# Patient Record
Sex: Male | Born: 1975
Health system: Southern US, Community
[De-identification: ages and names within clinical notes are randomized; demographics above are authoritative.]

## PROBLEM LIST (undated history)

## (undated) DIAGNOSIS — H1013 Acute atopic conjunctivitis, bilateral: Secondary | ICD-10-CM

## (undated) DIAGNOSIS — T7840XA Allergy, unspecified, initial encounter: Secondary | ICD-10-CM

## (undated) DIAGNOSIS — K219 Gastro-esophageal reflux disease without esophagitis: Secondary | ICD-10-CM

## (undated) DIAGNOSIS — J309 Allergic rhinitis, unspecified: Secondary | ICD-10-CM

## (undated) DIAGNOSIS — E785 Hyperlipidemia, unspecified: Secondary | ICD-10-CM

## (undated) HISTORY — DX: Gastro-esophageal reflux disease without esophagitis: K21.9

## (undated) HISTORY — DX: Allergic rhinitis, unspecified: J30.9

## (undated) HISTORY — DX: Acute atopic conjunctivitis, bilateral: H10.13

## (undated) HISTORY — DX: Hyperlipidemia, unspecified: E78.5

## (undated) HISTORY — DX: Allergy, unspecified, initial encounter: T78.40XA

## (undated) HISTORY — PX: COLONOSCOPY: SHX174

---

## 2001-07-04 ENCOUNTER — Ambulatory Visit (HOSPITAL_COMMUNITY): Admission: RE | Admit: 2001-07-04 | Discharge: 2001-07-04 | Payer: Self-pay | Admitting: Internal Medicine

## 2001-07-04 ENCOUNTER — Encounter: Payer: Self-pay | Admitting: Internal Medicine

## 2004-11-12 ENCOUNTER — Emergency Department (HOSPITAL_COMMUNITY): Admission: EM | Admit: 2004-11-12 | Discharge: 2004-11-12 | Payer: Self-pay | Admitting: Emergency Medicine

## 2005-05-17 ENCOUNTER — Ambulatory Visit (HOSPITAL_COMMUNITY): Admission: RE | Admit: 2005-05-17 | Discharge: 2005-05-17 | Payer: Self-pay | Admitting: Family Medicine

## 2012-01-25 ENCOUNTER — Encounter: Payer: Self-pay | Admitting: Internal Medicine

## 2012-01-25 DIAGNOSIS — Z0001 Encounter for general adult medical examination with abnormal findings: Secondary | ICD-10-CM | POA: Insufficient documentation

## 2012-01-25 DIAGNOSIS — Z0000 Encounter for general adult medical examination without abnormal findings: Secondary | ICD-10-CM | POA: Insufficient documentation

## 2012-01-29 ENCOUNTER — Ambulatory Visit (INDEPENDENT_AMBULATORY_CARE_PROVIDER_SITE_OTHER): Payer: BC Managed Care – PPO | Admitting: Internal Medicine

## 2012-01-29 ENCOUNTER — Telehealth: Payer: Self-pay | Admitting: Internal Medicine

## 2012-01-29 ENCOUNTER — Encounter: Payer: Self-pay | Admitting: Internal Medicine

## 2012-01-29 ENCOUNTER — Other Ambulatory Visit (INDEPENDENT_AMBULATORY_CARE_PROVIDER_SITE_OTHER): Payer: BC Managed Care – PPO

## 2012-01-29 VITALS — BP 128/88 | HR 54 | Temp 98.2°F | Ht 65.0 in | Wt 169.0 lb

## 2012-01-29 DIAGNOSIS — J309 Allergic rhinitis, unspecified: Secondary | ICD-10-CM

## 2012-01-29 DIAGNOSIS — H1045 Other chronic allergic conjunctivitis: Secondary | ICD-10-CM

## 2012-01-29 DIAGNOSIS — A64 Unspecified sexually transmitted disease: Secondary | ICD-10-CM

## 2012-01-29 DIAGNOSIS — H1013 Acute atopic conjunctivitis, bilateral: Secondary | ICD-10-CM

## 2012-01-29 DIAGNOSIS — J45909 Unspecified asthma, uncomplicated: Secondary | ICD-10-CM | POA: Insufficient documentation

## 2012-01-29 DIAGNOSIS — N476 Balanoposthitis: Secondary | ICD-10-CM

## 2012-01-29 DIAGNOSIS — Z0000 Encounter for general adult medical examination without abnormal findings: Secondary | ICD-10-CM

## 2012-01-29 DIAGNOSIS — N481 Balanitis: Secondary | ICD-10-CM | POA: Insufficient documentation

## 2012-01-29 HISTORY — DX: Acute atopic conjunctivitis, bilateral: H10.13

## 2012-01-29 LAB — LIPID PANEL
Cholesterol: 180 mg/dL (ref 0–200)
LDL Cholesterol: 125 mg/dL — ABNORMAL HIGH (ref 0–99)
Triglycerides: 24 mg/dL (ref 0.0–149.0)
VLDL: 4.8 mg/dL (ref 0.0–40.0)

## 2012-01-29 LAB — HIV ANTIBODY (ROUTINE TESTING W REFLEX): HIV: NONREACTIVE

## 2012-01-29 LAB — BASIC METABOLIC PANEL
BUN: 14 mg/dL (ref 6–23)
Chloride: 101 mEq/L (ref 96–112)
Creatinine, Ser: 1.2 mg/dL (ref 0.4–1.5)
Glucose, Bld: 89 mg/dL (ref 70–99)

## 2012-01-29 LAB — HEPATIC FUNCTION PANEL
AST: 26 U/L (ref 0–37)
Albumin: 4.2 g/dL (ref 3.5–5.2)
Alkaline Phosphatase: 54 U/L (ref 39–117)
Total Protein: 7.5 g/dL (ref 6.0–8.3)

## 2012-01-29 LAB — URINALYSIS, ROUTINE W REFLEX MICROSCOPIC
Hgb urine dipstick: NEGATIVE
Ketones, ur: NEGATIVE
Leukocytes, UA: NEGATIVE
Specific Gravity, Urine: 1.015 (ref 1.000–1.030)
Urobilinogen, UA: 1 (ref 0.0–1.0)

## 2012-01-29 LAB — CBC WITH DIFFERENTIAL/PLATELET
Basophils Absolute: 0 10*3/uL (ref 0.0–0.1)
Eosinophils Absolute: 0.1 10*3/uL (ref 0.0–0.7)
Lymphocytes Relative: 33.9 % (ref 12.0–46.0)
Lymphs Abs: 2 10*3/uL (ref 0.7–4.0)
Monocytes Relative: 7.6 % (ref 3.0–12.0)
Platelets: 177 10*3/uL (ref 150.0–400.0)
RDW: 12.9 % (ref 11.5–14.6)

## 2012-01-29 LAB — TSH: TSH: 1.22 u[IU]/mL (ref 0.35–5.50)

## 2012-01-29 MED ORDER — OLOPATADINE HCL 0.1 % OP SOLN: 1.0000 [drp] | Freq: Two times a day (BID) | OPHTHALMIC | Status: AC | PRN

## 2012-01-29 MED ORDER — CLOTRIMAZOLE-BETAMETHASONE 1-0.05 % EX CREA: TOPICAL_CREAM | CUTANEOUS | Status: AC

## 2012-01-29 MED ORDER — FEXOFENADINE HCL 180 MG PO TABS: 180.0000 mg | ORAL_TABLET | Freq: Every day | ORAL | Status: DC

## 2012-01-29 MED ORDER — METHYLPREDNISOLONE ACETATE 80 MG/ML IJ SUSP
120.0000 mg | Freq: Once | INTRAMUSCULAR | Status: AC
  Administered 2012-01-29: 120 mg via INTRAMUSCULAR

## 2012-01-29 NOTE — Telephone Encounter (Signed)
Done per emr 

## 2012-01-29 NOTE — Assessment & Plan Note (Signed)
Mild to mod, for allegra prn, and depomedrol IM today,  to f/u any worsening symptoms or concerns

## 2012-01-29 NOTE — Patient Instructions (Signed)
You had the steroid shot today for the allergy symptoms Take all new medications as prescribed - the generic allegra as needed for allergy, as well the cream for the rash as discussed You are otherwise up to date with prevention Please go to LAB in the Basement for the blood and/or urine tests to be done today You will be contacted by phone if any changes need to be made immediately.  Otherwise, you will receive a letter about your results with an explanation. Please return in 1 year for your yearly visit, or sooner if needed, with Lab testing done 3-5 days before

## 2012-01-29 NOTE — Progress Notes (Signed)
Subjective:    Patient ID: JEREMY DITULLIO, male    DOB: 08-13-1976, 36 y.o.   MRN: [id number]  HPI  Here to establish as new pt, father comes here as well.  Last saw MD approx 2007, usually in excellent health. Here for wellness;  Overall doing ok;  Pt denies CP, worsening SOB, DOE, wheezing, orthopnea, PND, worsening LE edema, palpitations, dizziness or syncope.  Pt denies neurological change such as new Headache, facial or extremity weakness.  Pt denies polydipsia, polyuria, or low sugar symptoms. Pt states overall good compliance with treatment and medications, good tolerability, and trying to follow lower cholesterol diet.  Pt denies worsening depressive symptoms, suicidal ideation or panic. No fever, wt loss, night sweats, loss of appetite, or other constitutional symptoms.  Pt states good ability with ADL's, low fall risk, home safety reviewed and adequate, no significant changes in hearing or vision, and occasionally active with exercise.  Does have concern about STD and has a discoloration of the glans penis for exam today as well.  Also  - Does have several wks ongoing bilat eye allergy symptoms with mild erythema, itch without signficnat d/c, pain, vision change, fever, and no signficant nasal symptoms Past Medical History  Diagnosis Date  . Asthma   . Allergic rhinitis, cause unspecified    History reviewed. No pertinent past surgical history.  reports that he has never smoked. He has never used smokeless tobacco. He reports that he does not drink alcohol or use illicit drugs. family history includes Cancer in his other; Diabetes in his others; and Heart disease in his other. No Known Allergies No current outpatient prescriptions on file prior to visit.   Review of Systems Review of Systems  Constitutional: Negative for diaphoresis, activity change, appetite change and unexpected weight change.  HENT: Negative for hearing loss, ear pain, facial swelling, mouth sores and neck  stiffness.   Eyes: Negative for pain, redness and visual disturbance.  Respiratory: Negative for shortness of breath and wheezing.   Cardiovascular: Negative for chest pain and palpitations.  Gastrointestinal: Negative for diarrhea, blood in stool, abdominal distention and rectal pain.  Genitourinary: Negative for hematuria, flank pain and decreased urine volume.  Musculoskeletal: Negative for myalgias and joint swelling.  Skin: Negative for color change and wound.  Neurological: Negative for syncope and numbness.  Hematological: Negative for adenopathy.  Psychiatric/Behavioral: Negative for hallucinations, self-injury, decreased concentration and agitation.      Objective:   Physical Exam BP 128/88  Pulse 54  Temp(Src) 98.2 F (36.8 C) (Oral)  Ht 5\' 5"  (1.651 m)  Wt 169 lb (76.658 kg)  BMI 28.12 kg/m2  SpO2 96% Physical Exam  VS noted Constitutional: Pt is oriented to person, place, and time. Appears well-developed and well-nourished.  HENT: Bilat tm's mild erythema.  Sinus nontender.  Pharynx mild erythema Head: Normocephalic and atraumatic.  Right Ear: External ear normal.  Left Ear: External ear normal.  Nose: Nose normal.  Mouth/Throat: Oropharynx is clear and moist.  Eyes: Conjunctivae and EOM are normal except for bilat mild erythema  Pupils are equal, round, and reactive to light.  Neck: Normal range of motion. Neck supple. No JVD present. No tracheal deviation present.  Cardiovascular: Normal rate, regular rhythm, normal heart sounds and intact distal pulses.   Pulmonary/Chest: Effort normal and breath sounds normal.  Abdominal: Soft. Bowel sounds are normal. There is no tenderness.  Musculoskeletal: Normal range of motion. Exhibits no edema.  Lymphadenopathy:  Has no cervical adenopathy.  Neurological:  Pt is alert and oriented to person, place, and time. Pt has normal reflexes. No cranial nerve deficit.  Skin: Skin is warm and dry. No rash noted.  Psychiatric:  Has   normal mood and affect. Behavior is normal.  GU: normal male penis and scrotum/contents except for mild nontender lightening/discoloration of most of glans penis, no penile d/c or other ulcer, swelling    Assessment & Plan:

## 2012-01-29 NOTE — Assessment & Plan Note (Signed)
Pt requests std labs - will order

## 2012-01-29 NOTE — Assessment & Plan Note (Signed)
Mild to mod, for antibx course,  to f/u any worsening symptoms or concerns 

## 2012-01-29 NOTE — Telephone Encounter (Signed)
Message copied by Corwin Levins on Tue Jan 29, 2012  2:10 PM ------      Message from: Scharlene Gloss B      Created: Tue Jan 29, 2012  1:50 PM       The patient would like eye drops sent to pharmacy.

## 2012-01-30 ENCOUNTER — Encounter: Payer: Self-pay | Admitting: Internal Medicine

## 2012-01-30 LAB — HEPATITIS PANEL, ACUTE: HCV Ab: NEGATIVE

## 2012-01-30 LAB — GC/CHLAMYDIA PROBE AMP, URINE: GC Probe Amp, Urine: NEGATIVE

## 2012-01-30 LAB — HSV 2 ANTIBODY, IGG: HSV 2 Glycoprotein G Ab, IgG: <0.1 IV

## 2012-05-28 ENCOUNTER — Telehealth: Payer: Self-pay

## 2012-05-28 DIAGNOSIS — D759 Disease of blood and blood-forming organs, unspecified: Secondary | ICD-10-CM

## 2012-05-28 NOTE — Telephone Encounter (Signed)
The patients wife is pregnant and OBGYN is requesting he be tested for sickle cell.  Please advise call back number is [phone]

## 2012-05-28 NOTE — Telephone Encounter (Signed)
Order done

## 2012-05-28 NOTE — Telephone Encounter (Signed)
Patient informed. 

## 2012-05-30 ENCOUNTER — Other Ambulatory Visit: Payer: Self-pay | Admitting: Internal Medicine

## 2012-05-30 ENCOUNTER — Other Ambulatory Visit: Payer: BC Managed Care – PPO

## 2012-05-30 DIAGNOSIS — D759 Disease of blood and blood-forming organs, unspecified: Secondary | ICD-10-CM

## 2012-06-03 ENCOUNTER — Encounter: Payer: Self-pay | Admitting: Internal Medicine

## 2012-06-03 LAB — HEMOGLOBINOPATHY EVALUATION
Hemoglobin Other: 0 %
Hgb A: 96.9 % (ref 96.8–97.8)

## 2012-08-29 ENCOUNTER — Telehealth: Payer: Self-pay | Admitting: *Deleted

## 2012-08-29 ENCOUNTER — Ambulatory Visit: Payer: Self-pay | Admitting: Family Medicine

## 2012-08-29 VITALS — BP 118/78 | HR 74 | Temp 98.3°F | Resp 16 | Ht 65.0 in | Wt 174.8 lb

## 2012-08-29 DIAGNOSIS — Z0289 Encounter for other administrative examinations: Secondary | ICD-10-CM

## 2012-08-29 NOTE — Progress Notes (Signed)
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 He does have chronic allergies, especially the eyes but also rash respiratory.  Objective: HEENT TMs are normal eyes slightly injected PERRLA throat clear. Neck supple without nodes thyromegaly. Chest clear. Heart regular without murmurs. And soft without mass tenderness. Normal external genitalia. No nodes were noted. Extremities unremarkable. Spine normal. Skin normal.  Assessment: Normal DOT examination  Plan: Form completed 2 year card

## 2012-08-29 NOTE — Telephone Encounter (Signed)
Steward Drone called to give a fax number to send dot form because monocular vision was not checked. So I checked and initialed and tried to faxed but the fax number given by Steward Drone was wrong.  Pt will call us back with correct number.  Form is in phone messages at nurses station.  Please refax.

## 2012-08-29 NOTE — Patient Instructions (Addendum)
Return as needed

## 2012-09-01 ENCOUNTER — Telehealth: Payer: Self-pay

## 2012-09-01 NOTE — Telephone Encounter (Signed)
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 He says we were trying to fax this to his employer but it would not go thru. He called this morning to provide correct fax # [phone]  Pt [phone]

## 2012-09-01 NOTE — Telephone Encounter (Signed)
Spoke with pt and he gave me another fax number. Faxed

## 2013-05-08 ENCOUNTER — Ambulatory Visit: Payer: Self-pay | Admitting: Physician Assistant

## 2013-05-08 VITALS — BP 120/74 | HR 94 | Temp 103.1°F | Resp 16 | Ht 64.5 in | Wt 172.8 lb

## 2013-05-08 DIAGNOSIS — J020 Streptococcal pharyngitis: Secondary | ICD-10-CM

## 2013-05-08 DIAGNOSIS — R509 Fever, unspecified: Secondary | ICD-10-CM

## 2013-05-08 DIAGNOSIS — J029 Acute pharyngitis, unspecified: Secondary | ICD-10-CM

## 2013-05-08 LAB — POCT RAPID STREP A (OFFICE): Rapid Strep A Screen: POSITIVE — AB

## 2013-05-08 MED ORDER — AMOXICILLIN 500 MG PO TABS: 500.0000 mg | ORAL_TABLET | Freq: Two times a day (BID) | ORAL | Status: DC

## 2013-05-08 NOTE — Progress Notes (Signed)
  Subjective:    Patient ID: Andrew Bailey, male    DOB: 1976/10/07, 36 y.o.   MRN: [id number]  HPI 37 year old male presents with 3 day history of sore throat, fever, chills, body aches, and headache. Admits it has become increasingly painful to swallow. Says his wife had a sore throat x 2 days but hers has improved significantly with ibuprofen only. No known strep contacts or significant hx of strep.  Denies nasal congestion, otalgia, sinus pain, cough, dizziness, nausea, or vomiting.  Patient is otherwise healthy with no other concerns today.     Review of Systems  Constitutional: Positive for fever and chills.  HENT: Positive for sore throat. Negative for ear pain, congestion, rhinorrhea and postnasal drip.   Respiratory: Negative for cough.   Gastrointestinal: Negative for nausea, vomiting and abdominal pain.  Neurological: Positive for headaches. Negative for dizziness.       Objective:   Physical Exam  Constitutional: He is oriented to person, place, and time. He appears well-developed and well-nourished.  HENT:  Head: Normocephalic and atraumatic.  Right Ear: Hearing, tympanic membrane, external ear and ear canal normal.  Left Ear: Hearing, tympanic membrane, external ear and ear canal normal.  Mouth/Throat: Uvula is midline and mucous membranes are normal. Oropharyngeal exudate (2+ tonsillar swelling) and posterior oropharyngeal erythema present. No posterior oropharyngeal edema or tonsillar abscesses.  Eyes: Conjunctivae are normal.  Neck: Normal range of motion. Neck supple.  Cardiovascular: Normal rate, regular rhythm and normal heart sounds.   Pulmonary/Chest: Effort normal and breath sounds normal.  Lymphadenopathy:    He has cervical adenopathy.  Neurological: He is alert and oriented to person, place, and time.  Psychiatric: He has a normal mood and affect. His behavior is normal. Judgment and thought content normal.   Results for orders placed in visit on  05/08/13  POCT RAPID STREP A (OFFICE)      Result Value Range   Rapid Strep A Screen Positive (*) Negative          Assessment & Plan:  Acute pharyngitis - Plan: POCT rapid strep A  Fever, unspecified  Streptococcal sore throat - Plan: amoxicillin (AMOXIL) 500 MG tablet  Amoxicillin 500 mg bid x 10 days Continue ibuprofen and tylenol in alternating doses q4-6 hours prn fever and pain Increase fluids and rest Follow up if symptoms worsen or fail to improve.

## 2013-05-26 ENCOUNTER — Other Ambulatory Visit: Payer: Self-pay | Admitting: Family Medicine

## 2013-05-26 DIAGNOSIS — J020 Streptococcal pharyngitis: Secondary | ICD-10-CM

## 2013-05-26 MED ORDER — AMOXICILLIN 500 MG PO TABS: 500.0000 mg | ORAL_TABLET | Freq: Two times a day (BID) | ORAL | Status: DC

## 2013-08-12 ENCOUNTER — Telehealth: Payer: Self-pay

## 2013-08-12 DIAGNOSIS — Z0000 Encounter for general adult medical examination without abnormal findings: Secondary | ICD-10-CM

## 2013-08-12 NOTE — Telephone Encounter (Signed)
Labs entered.

## 2013-09-04 ENCOUNTER — Ambulatory Visit: Payer: BC Managed Care – PPO | Admitting: Emergency Medicine

## 2013-09-04 VITALS — BP 120/76 | HR 72 | Temp 98.2°F | Resp 16 | Ht 64.75 in | Wt 172.4 lb

## 2013-09-04 DIAGNOSIS — J209 Acute bronchitis, unspecified: Secondary | ICD-10-CM

## 2013-09-04 MED ORDER — AZITHROMYCIN 250 MG PO TABS: ORAL_TABLET | ORAL | Status: DC

## 2013-09-04 MED ORDER — GUAIFENESIN-DM 100-10 MG/5ML PO SYRP: 5.0000 mL | ORAL_SOLUTION | ORAL | Status: DC | PRN

## 2013-09-04 NOTE — Patient Instructions (Signed)

## 2013-09-04 NOTE — Progress Notes (Signed)
[facility] [address] [place] [address] [id number] [phone]  Date:  09/04/2013   Name:  Andrew Bailey   DOB:  07/30/1976   MRN:  [id number]  PCP:  Oliver Barre, MD    Chief Complaint: Cough and Sore Throat   History of Present Illness:  Andrew Bailey is a 37 y.o. very pleasant male patient who presents with the following:  1 week history of cough productive mucopurulent sputum.  No worse when lays down.  No wheezing or shortness of breath.  No nausea or vomiting.  No stool change, rash or coryza.  Started with sore throat.  No ill contact.  No improvement with over the counter medications or other home remedies. Denies other complaint or health concern today.   Patient Active Problem List   Diagnosis Date Noted  . Allergic conjunctivitis of both eyes 01/29/2012  . Balanitis 01/29/2012  . Venereal disease, unspecified 01/29/2012  . Asthma   . Allergic rhinitis, cause unspecified   . Preventative health care 01/25/2012    Past Medical History  Diagnosis Date  . Allergic rhinitis, cause unspecified   . Allergic conjunctivitis of both eyes 01/29/2012  . Asthma     No past surgical history on file.  History  Substance Use Topics  . Smoking status: Never Smoker   . Smokeless tobacco: Never Used  . Alcohol Use: No    Family History  Problem Relation Age of Onset  . Heart disease Other   . Diabetes Other   . Cancer Other     colon and breast cancer  . Diabetes Other   . Diabetes Mother   . Heart disease Father   . Diabetes Father   . Hypertension Sister     No Known Allergies  Medication list has been reviewed and updated.  Current Outpatient Prescriptions on File Prior to Visit  Medication Sig Dispense Refill  . ibuprofen (ADVIL,MOTRIN) 400 MG tablet Take 400 mg by mouth every 6 (six) hours as needed for pain.      Marland Kitchen loratadine (CLARITIN) 10 MG tablet Take 10 mg by mouth daily.      Marland Kitchen amoxicillin (AMOXIL) 500 MG tablet Take 1  tablet (500 mg total) by mouth 2 (two) times daily.  20 tablet  0  . fexofenadine (ALLEGRA) 180 MG tablet Take 1 tablet (180 mg total) by mouth daily.  30 tablet  11   No current facility-administered medications on file prior to visit.    Review of Systems:  As per HPI, otherwise negative.    Physical Examination: Filed Vitals:   09/04/13 0844  BP: 120/76  Pulse: 72  Temp: 98.2 F (36.8 C)  Resp: 16   Filed Vitals:   09/04/13 0844  Height: 5' 4.75" (1.645 m)  Weight: 172 lb 6.4 oz (78.2 kg)   Body mass index is 28.9 kg/(m^2). Ideal Body Weight: Weight in (lb) to have BMI = 25: 148.8  GEN: WDWN, NAD, Non-toxic, A & O x 3 HEENT: Atraumatic, Normocephalic. Neck supple. No masses, No LAD. Ears and Nose: No external deformity. CV: RRR, No M/G/R. No JVD. No thrill. No extra heart sounds. PULM: CTA B, no wheezes, crackles, rhonchi. No retractions. No resp. distress. No accessory muscle use. ABD: S, NT, ND, +BS. No rebound. No HSM. EXTR: No c/c/e NEURO Normal gait.  PSYCH: Normally interactive. Conversant. Not depressed or anxious appearing.  Calm demeanor.    Assessment and Plan: Bronchitis zpak Follow up as  needed   Signed,  Phillips Odor, MD

## 2013-10-07 ENCOUNTER — Encounter: Payer: Self-pay | Admitting: Internal Medicine

## 2013-10-07 ENCOUNTER — Other Ambulatory Visit (INDEPENDENT_AMBULATORY_CARE_PROVIDER_SITE_OTHER): Payer: BC Managed Care – PPO

## 2013-10-07 ENCOUNTER — Ambulatory Visit (INDEPENDENT_AMBULATORY_CARE_PROVIDER_SITE_OTHER): Payer: BC Managed Care – PPO | Admitting: Internal Medicine

## 2013-10-07 VITALS — BP 124/80 | HR 67 | Temp 97.9°F | Ht 64.75 in | Wt 174.0 lb

## 2013-10-07 DIAGNOSIS — Z0000 Encounter for general adult medical examination without abnormal findings: Secondary | ICD-10-CM

## 2013-10-07 DIAGNOSIS — J309 Allergic rhinitis, unspecified: Secondary | ICD-10-CM

## 2013-10-07 DIAGNOSIS — Z23 Encounter for immunization: Secondary | ICD-10-CM

## 2013-10-07 LAB — URINALYSIS, ROUTINE W REFLEX MICROSCOPIC
Bilirubin Urine: NEGATIVE
Hgb urine dipstick: NEGATIVE
Ketones, ur: NEGATIVE
Leukocytes, UA: NEGATIVE
Nitrite: NEGATIVE
RBC / HPF: NONE SEEN
Specific Gravity, Urine: 1.02
Total Protein, Urine: NEGATIVE
Urine Glucose: NEGATIVE
Urobilinogen, UA: 0.2
WBC, UA: NONE SEEN
pH: 6.5 (ref 5.0–8.0)

## 2013-10-07 LAB — LIPID PANEL
Cholesterol: 186 mg/dL (ref 0–200)
HDL: 45.3 mg/dL (ref >39.00)
Total CHOL/HDL Ratio: 4
Triglycerides: 53 mg/dL (ref 0.0–149.0)

## 2013-10-07 LAB — CBC WITH DIFFERENTIAL/PLATELET
Basophils Absolute: 0 10*3/uL (ref 0.0–0.1)
Basophils Relative: 0.4 % (ref 0.0–3.0)
Eosinophils Absolute: 0.2 10*3/uL (ref 0.0–0.7)
Hemoglobin: 16.6 g/dL (ref 13.0–17.0)
Lymphocytes Relative: 32.9 % (ref 12.0–46.0)
MCHC: 33.2 g/dL (ref 30.0–36.0)
MCV: 84.9 fl (ref 78.0–100.0)
Monocytes Relative: 9.3 % (ref 3.0–12.0)
Neutro Abs: 4.1 10*3/uL (ref 1.4–7.7)
Neutrophils Relative %: 55 % (ref 43.0–77.0)
RBC: 5.88 Mil/uL — ABNORMAL HIGH (ref 4.22–5.81)

## 2013-10-07 LAB — HEPATIC FUNCTION PANEL
ALT: 45 U/L (ref 0–53)
AST: 29 U/L (ref 0–37)
Albumin: 4.2 g/dL (ref 3.5–5.2)
Alkaline Phosphatase: 55 U/L (ref 39–117)
Bilirubin, Direct: 0.1 mg/dL (ref 0.0–0.3)
Total Bilirubin: 0.8 mg/dL (ref 0.3–1.2)
Total Protein: 7.3 g/dL (ref 6.0–8.3)

## 2013-10-07 LAB — BASIC METABOLIC PANEL
BUN: 20 mg/dL (ref 6–23)
CO2: 30 mEq/L (ref 19–32)
Calcium: 9.2 mg/dL (ref 8.4–10.5)
Chloride: 103 mEq/L (ref 96–112)
Creatinine, Ser: 1.2 mg/dL (ref 0.4–1.5)
Glucose, Bld: 75 mg/dL (ref 70–99)

## 2013-10-07 LAB — TSH: TSH: 1.64 u[IU]/mL (ref 0.35–5.50)

## 2013-10-07 MED ORDER — FLUTICASONE PROPIONATE 50 MCG/ACT NA SUSP: 2.0000 | Freq: Every day | NASAL | Status: DC

## 2013-10-07 NOTE — Patient Instructions (Signed)
You had the flu shot today, and the Tetanus with pertussis (Tdap) shot today Please take all new medication as prescribed- the generic flonase for the nasal allergies You can also use OTC Zaditor for the eyes as well for allergies Please continue all other medications as before Please have the pharmacy call with any other refills you may need. Please continue your efforts at being more active, low cholesterol diet, and weight control. You are otherwise up to date with prevention measures today. Please go to the LAB in the Basement (turn left off the elevator) for the tests to be done today You will be contacted by phone if any changes need to be made immediately.  Otherwise, you will receive a letter about your results with an explanation, but please check with MyChart first.  Please remember to sign up for My Chart if you have not done so, as this will be important to you in the future with finding out test results, communicating by private email, and scheduling acute appointments online when needed.  Please return in 1 year for your yearly visit, or sooner if needed, with Lab testing done 3-5 days before

## 2013-10-07 NOTE — Progress Notes (Signed)
Subjective:    Patient ID: Andrew Bailey, male    DOB: Mar 07, 1976, 37 y.o.   MRN: [id number]  HPI  Here for wellness and f/u;  Overall doing ok;  Pt denies CP, worsening SOB, DOE, wheezing, orthopnea, PND, worsening LE edema, palpitations, dizziness or syncope.  Pt denies neurological change such as new headache, facial or extremity weakness.  Pt denies polydipsia, polyuria, or low sugar symptoms. Pt states overall good compliance with treatment and medications, good tolerability, and has been trying to follow lower cholesterol diet.  Pt denies worsening depressive symptoms, suicidal ideation or panic. No fever, night sweats, wt loss, loss of appetite, or other constitutional symptoms.  Pt states good ability with ADL's, has low fall risk, home safety reviewed and adequate, no other significant changes in hearing or vision, and only occasionally active with exercise.  Does have several wks ongoing nasal allergy symptoms with clearish congestion, itch and sneezing, without fever, pain, ST, cough, swelling or wheezing. Past Medical History  Diagnosis Date  . Allergic rhinitis, cause unspecified   . Allergic conjunctivitis of both eyes 01/29/2012  . Asthma    No past surgical history on file.  reports that he has never smoked. He has never used smokeless tobacco. He reports that he does not drink alcohol or use illicit drugs. family history includes Cancer in his other; Diabetes in his father, mother, other, and other; Heart disease in his father and other; Hypertension in his sister. No Known Allergies Current Outpatient Prescriptions on File Prior to Visit  Medication Sig Dispense Refill  . ibuprofen (ADVIL,MOTRIN) 400 MG tablet Take 400 mg by mouth every 6 (six) hours as needed for pain.      Marland Kitchen loratadine (CLARITIN) 10 MG tablet Take 10 mg by mouth daily.      Marland Kitchen amoxicillin (AMOXIL) 500 MG tablet Take 1 tablet (500 mg total) by mouth 2 (two) times daily.  20 tablet  0  . azithromycin  (ZITHROMAX) 250 MG tablet Take 2 tabs PO x 1 dose, then 1 tab PO QD x 4 days  6 tablet  0  . fexofenadine (ALLEGRA) 180 MG tablet Take 1 tablet (180 mg total) by mouth daily.  30 tablet  11  . guaiFENesin-dextromethorphan (ROBITUSSIN DM) 100-10 MG/5ML syrup Take 5 mLs by mouth every 4 (four) hours as needed for cough.  118 mL  0   No current facility-administered medications on file prior to visit.   Review of Systems Constitutional: Negative for diaphoresis, activity change, appetite change or unexpected weight change.  HENT: Negative for hearing loss, ear pain, facial swelling, mouth sores and neck stiffness.   Eyes: Negative for pain, redness and visual disturbance.  Respiratory: Negative for shortness of breath and wheezing.   Cardiovascular: Negative for chest pain and palpitations.  Gastrointestinal: Negative for diarrhea, blood in stool, abdominal distention or other pain Genitourinary: Negative for hematuria, flank pain or change in urine volume.  Musculoskeletal: Negative for myalgias and joint swelling.  Skin: Negative for color change and wound.  Neurological: Negative for syncope and numbness. other than noted Hematological: Negative for adenopathy.  Psychiatric/Behavioral: Negative for hallucinations, self-injury, decreased concentration and agitation.      Objective:   Physical Exam BP 124/80  Pulse 67  Temp(Src) 97.9 F (36.6 C) (Oral)  Ht 5' 4.75" (1.645 m)  Wt 174 lb (78.926 kg)  BMI 29.17 kg/m2  SpO2 96% VS noted,  Constitutional: Pt is oriented to person, place, and time. Appears well-developed and  well-nourished.  Head: Normocephalic and atraumatic.  Right Ear: External ear normal.  Left Ear: External ear normal.  Nose: Nose normal.  Mouth/Throat: Oropharynx is clear and moist.  Bilat tm's with mild erythema.  Max sinus areas non tender.  Pharynx with mild erythema, no exudate Eyes: Conjunctivae and EOM are normal. Pupils are equal, round, and reactive to  light.  Neck: Normal range of motion. Neck supple. No JVD present. No tracheal deviation present.  Cardiovascular: Normal rate, regular rhythm, normal heart sounds and intact distal pulses.   Pulmonary/Chest: Effort normal and breath sounds normal.  Abdominal: Soft. Bowel sounds are normal. There is no tenderness. No HSM  Musculoskeletal: Normal range of motion. Exhibits no edema.  Lymphadenopathy:  Has no cervical adenopathy.  Neurological: Pt is alert and oriented to person, place, and time. Pt has normal reflexes. No cranial nerve deficit.  Skin: Skin is warm and dry. No rash noted.  Psychiatric:  Has  normal mood and affect. Behavior is normal.      Assessment & Plan:

## 2013-10-07 NOTE — Assessment & Plan Note (Signed)
For flonase asd, zaditor prn

## 2013-10-07 NOTE — Progress Notes (Signed)
Pre visit review using our clinic review tool, if applicable. No additional management support is needed unless otherwise documented below in the visit note. 

## 2013-10-07 NOTE — Assessment & Plan Note (Signed)

## 2014-01-13 ENCOUNTER — Encounter: Payer: Self-pay | Admitting: Internal Medicine

## 2014-01-13 ENCOUNTER — Ambulatory Visit (INDEPENDENT_AMBULATORY_CARE_PROVIDER_SITE_OTHER): Payer: BC Managed Care – PPO | Admitting: Internal Medicine

## 2014-01-13 VITALS — BP 120/80 | HR 65 | Temp 98.3°F | Ht 65.0 in | Wt 170.0 lb

## 2014-01-13 DIAGNOSIS — R21 Rash and other nonspecific skin eruption: Secondary | ICD-10-CM

## 2014-01-13 MED ORDER — CLOTRIMAZOLE-BETAMETHASONE 1-0.05 % EX CREA: TOPICAL_CREAM | CUTANEOUS | Status: DC

## 2014-01-13 NOTE — Progress Notes (Signed)
   Subjective:    Patient ID: Andrew Bailey, male    DOB: January 20, 1976, 38 y.o.   MRN: [id number]  HPI  Here to f/u with c/o itchy rash with several discrete oval and round areas various sized up to 2 cm, to legs from hips to knees, some few below knee on right. No fever, trauma, wt loss, swelling, or prior hx of same. Past Medical History  Diagnosis Date  . Allergic rhinitis, cause unspecified   . Allergic conjunctivitis of both eyes 01/29/2012  . Asthma    No past surgical history on file.  reports that he has never smoked. He has never used smokeless tobacco. He reports that he does not drink alcohol or use illicit drugs. family history includes Cancer in his other; Diabetes in his father, mother, other, and other; Heart disease in his father and other; Hypertension in his sister. No Known Allergies Current Outpatient Prescriptions on File Prior to Visit  Medication Sig Dispense Refill  . fexofenadine (ALLEGRA) 180 MG tablet Take 1 tablet (180 mg total) by mouth daily.  30 tablet  11  . fluticasone (FLONASE) 50 MCG/ACT nasal spray Place 2 sprays into both nostrils daily.  16 g  5  . ibuprofen (ADVIL,MOTRIN) 400 MG tablet Take 400 mg by mouth every 6 (six) hours as needed for pain.      Marland Kitchen loratadine (CLARITIN) 10 MG tablet Take 10 mg by mouth daily.       No current facility-administered medications on file prior to visit.   Review of Systems All otherwise neg per pt     Objective:   Physical Exam  Constitutional: Negative for unexpected weight change, or unusual diaphoresis  HENT: Negative for tinnitus.   Eyes: Negative for photophobia and visual disturbance.  Respiratory: Negative for choking and stridor.   Gastrointestinal: Negative for vomiting and blood in stool.  Genitourinary: Negative for hematuria and decreased urine volume.  Skin: Negative for color change and wound. except for mult mild erythem macular areas up to 2 cm oval or round to LE's, NT, no swelling      Assessment & Plan:

## 2014-01-13 NOTE — Progress Notes (Signed)
Pre visit review using our clinic review tool, if applicable. No additional management support is needed unless otherwise documented below in the visit note. 

## 2014-01-13 NOTE — Assessment & Plan Note (Signed)
C/w fungal or irritiative, for lotrisone asd,  to f/u any worsening symptoms or concerns

## 2014-01-13 NOTE — Patient Instructions (Signed)
Please take all new medication as prescribed Please continue all other medications as before, and refills have been done if requested. Please have the pharmacy call with any other refills you may need.  Please continue your efforts at being more active, low cholesterol diet, and weight control.  Please keep your appointments with your specialists as you may have planned  Please remember to sign up for MyChart if you have not done so, as this will be important to you in the future with finding out test results, communicating by private email, and scheduling acute appointments online when needed.

## 2014-04-23 ENCOUNTER — Telehealth: Payer: Self-pay | Admitting: Internal Medicine

## 2014-04-23 NOTE — Telephone Encounter (Signed)
Pt called stated that he got a bill from the ov with Dr. Jenny Reichmann 01/13/14. He talked to the billing and they told him to call our office for more detail about what he got charge for. Please call pt

## 2014-07-19 ENCOUNTER — Ambulatory Visit (INDEPENDENT_AMBULATORY_CARE_PROVIDER_SITE_OTHER): Payer: BC Managed Care – PPO | Admitting: Physician Assistant

## 2014-07-19 VITALS — BP 118/70 | HR 74 | Temp 98.7°F | Resp 16 | Ht 64.75 in | Wt 172.2 lb

## 2014-07-19 DIAGNOSIS — J029 Acute pharyngitis, unspecified: Secondary | ICD-10-CM

## 2014-07-19 DIAGNOSIS — J020 Streptococcal pharyngitis: Secondary | ICD-10-CM

## 2014-07-19 LAB — POCT RAPID STREP A (OFFICE): RAPID STREP A SCREEN: POSITIVE — AB

## 2014-07-19 MED ORDER — PENICILLIN G BENZATHINE 1200000 UNIT/2ML IM SUSP
1.2000 10*6.[IU] | Freq: Once | INTRAMUSCULAR | Status: AC
  Administered 2014-07-19: 1.2 10*6.[IU] via INTRAMUSCULAR

## 2014-07-19 NOTE — Patient Instructions (Signed)

## 2014-07-19 NOTE — Progress Notes (Signed)
   Subjective:    Patient ID: Andrew Bailey, male    DOB: 1976-01-12, 38 y.o.   MRN: [id number]  HPI Patient presents to clinic for sore throat that began yesterday morning 07/18/14. Throat was worse this morning and is accompanied by headache and adenopathy. Denies fever or cough. 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 Has tried Ricola, Halls, saltwater gargle, Tylenol, Naproxen, and Mucinex D with minimal relief.   No medication allergies, but has allergies to fish and grass.   Review of Systems  Constitutional: Negative for fever, chills, activity change, appetite change and fatigue.  HENT: Positive for sore throat. Negative for congestion, ear discharge, ear pain, postnasal drip, rhinorrhea, sinus pressure, sneezing and trouble swallowing.   Eyes: Negative for pain and itching.  Respiratory: Negative for cough, chest tightness, shortness of breath and wheezing.   Cardiovascular: Negative for chest pain and palpitations.  Gastrointestinal: Negative for nausea, vomiting, abdominal pain, diarrhea and constipation.  Genitourinary: Negative.   Musculoskeletal: Negative for myalgias, neck pain and neck stiffness.  Skin: Negative for rash.  Allergic/Immunologic: Positive for environmental allergies and food allergies.  Neurological: Positive for headaches. Negative for dizziness and light-headedness.  Hematological: Positive for adenopathy.       Objective:   Physical Exam  Constitutional: He is oriented to person, place, and time. He appears well-developed and well-nourished. No distress.  HENT:  Head: Normocephalic and atraumatic.  Right Ear: External ear normal.  Left Ear: External ear normal.  Nose: Nose normal.  Mouth/Throat: Mucous membranes are not pale and not dry. Posterior oropharyngeal edema and posterior oropharyngeal erythema present. No oropharyngeal exudate.  R enlarged tonsil  Eyes: Conjunctivae and EOM are normal. Pupils are equal,  round, and reactive to light. Right eye exhibits discharge (watery). Left eye exhibits discharge (watery).  Neck: Neck supple. No JVD present. No thyromegaly present.  Cardiovascular: Normal rate, regular rhythm and normal heart sounds.  Exam reveals no gallop and no friction rub.   No murmur heard. Pulmonary/Chest: Effort normal and breath sounds normal. No respiratory distress. He has no wheezes. He has no rales.  Abdominal: Soft. He exhibits no mass. There is no tenderness.  Musculoskeletal: He exhibits no tenderness.  Lymphadenopathy:    He has cervical adenopathy.  Neurological: He is alert and oriented to person, place, and time.  Skin: Skin is warm and dry. No rash noted. He is not diaphoretic. No erythema. No pallor.  Psychiatric: He has a normal mood and affect. His behavior is normal. Judgment and thought content normal.   Blood pressure 118/70, pulse 74, temperature 98.7 F (37.1 C), temperature source Oral, resp. rate 16, height 5' 4.75" (1.645 m), weight 172 lb 3.2 oz (78.109 kg), SpO2 97.00%.  Results for orders placed in visit on 07/19/14  POCT RAPID STREP A (OFFICE)      Result Value Ref Range   Rapid Strep A Screen Positive (*) Negative       Assessment & Plan:  1. Strep pharyngitis 2. Sore throat - POCT rapid strep A - penicillin g benzathine (BICILLIN LA) 1200000 UNIT/2ML injection 1.2 Million Units; Inject 2 mLs (1.2 Million Units total) into the muscle once. - Gave Strep pharyngitis instructions.    Alveta Heimlich PA-C  [facility] 07/19/2014 4:10 PM

## 2014-07-19 NOTE — Progress Notes (Signed)
I have examined this patient along with Ms. Brewington, PA-C and agree.  

## 2014-08-16 ENCOUNTER — Ambulatory Visit (INDEPENDENT_AMBULATORY_CARE_PROVIDER_SITE_OTHER): Payer: Self-pay | Admitting: Family Medicine

## 2014-08-16 VITALS — BP 110/80 | HR 66 | Temp 98.1°F | Resp 16 | Ht 65.0 in | Wt 167.2 lb

## 2014-08-16 DIAGNOSIS — Z024 Encounter for examination for driving license: Secondary | ICD-10-CM

## 2014-08-16 DIAGNOSIS — Z029 Encounter for administrative examinations, unspecified: Secondary | ICD-10-CM

## 2014-08-16 NOTE — Patient Instructions (Signed)
Return as needed

## 2014-08-16 NOTE — Progress Notes (Signed)
DOT Physical exam:  History: DOT exam due.  Tries to stay healthy.  PMH No operations, illnesses, meds, or allergies other than fish and seasonal allergy and shellfish  Fm Hx Father heart pacemaker, DM  Soc.Hx. 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  Father of 39 year old.  ROS Negative  PE NOrmal exam, documented on DOT form  Imp: DOT, normal  Plan  2 yr card.

## 2014-12-08 ENCOUNTER — Ambulatory Visit (INDEPENDENT_AMBULATORY_CARE_PROVIDER_SITE_OTHER): Payer: BLUE CROSS/BLUE SHIELD | Admitting: Internal Medicine

## 2014-12-08 ENCOUNTER — Encounter: Payer: Self-pay | Admitting: Internal Medicine

## 2014-12-08 ENCOUNTER — Other Ambulatory Visit (INDEPENDENT_AMBULATORY_CARE_PROVIDER_SITE_OTHER): Payer: BLUE CROSS/BLUE SHIELD

## 2014-12-08 VITALS — BP 118/84 | HR 85 | Temp 98.6°F | Ht 65.0 in | Wt 174.0 lb

## 2014-12-08 DIAGNOSIS — Z0000 Encounter for general adult medical examination without abnormal findings: Secondary | ICD-10-CM

## 2014-12-08 DIAGNOSIS — Z23 Encounter for immunization: Secondary | ICD-10-CM

## 2014-12-08 LAB — HEPATIC FUNCTION PANEL
ALBUMIN: 4.3 g/dL (ref 3.5–5.2)
ALT: 38 U/L (ref 0–53)
AST: 25 U/L (ref 0–37)
Alkaline Phosphatase: 61 U/L (ref 39–117)
BILIRUBIN DIRECT: 0.1 mg/dL (ref 0.0–0.3)
Total Bilirubin: 0.7 mg/dL (ref 0.2–1.2)
Total Protein: 7.7 g/dL (ref 6.0–8.3)

## 2014-12-08 LAB — CBC WITH DIFFERENTIAL/PLATELET
BASOS ABS: 0 10*3/uL (ref 0.0–0.1)
Basophils Relative: 0.2 % (ref 0.0–3.0)
Eosinophils Absolute: 0.1 10*3/uL (ref 0.0–0.7)
Eosinophils Relative: 1.7 % (ref 0.0–5.0)
HCT: 49.9 % (ref 39.0–52.0)
HEMOGLOBIN: 16.8 g/dL (ref 13.0–17.0)
LYMPHS ABS: 1.2 10*3/uL (ref 0.7–4.0)
Lymphocytes Relative: 15.8 % (ref 12.0–46.0)
MCHC: 33.7 g/dL (ref 30.0–36.0)
MCV: 83.8 fl (ref 78.0–100.0)
Monocytes Absolute: 0.7 10*3/uL (ref 0.1–1.0)
Monocytes Relative: 8.8 % (ref 3.0–12.0)
Neutro Abs: 5.6 10*3/uL (ref 1.4–7.7)
Neutrophils Relative %: 73.5 % (ref 43.0–77.0)
PLATELETS: 194 10*3/uL (ref 150.0–400.0)
RBC: 5.95 Mil/uL — ABNORMAL HIGH (ref 4.22–5.81)
RDW: 12.8 % (ref 11.5–15.5)
WBC: 7.7 10*3/uL (ref 4.0–10.5)

## 2014-12-08 LAB — LIPID PANEL
CHOLESTEROL: 166 mg/dL (ref 0–200)
HDL: 38.9 mg/dL — ABNORMAL LOW (ref >39.00)
LDL CALC: 121 mg/dL — AB (ref 0–99)
NonHDL: 127.1
TRIGLYCERIDES: 33 mg/dL (ref 0.0–149.0)
Total CHOL/HDL Ratio: 4
VLDL: 6.6 mg/dL (ref 0.0–40.0)

## 2014-12-08 LAB — URINALYSIS, ROUTINE W REFLEX MICROSCOPIC
BILIRUBIN URINE: NEGATIVE
HGB URINE DIPSTICK: NEGATIVE
Ketones, ur: NEGATIVE
Leukocytes, UA: NEGATIVE
Nitrite: NEGATIVE
Specific Gravity, Urine: 1.02 (ref 1.000–1.030)
Total Protein, Urine: NEGATIVE
URINE GLUCOSE: NEGATIVE
Urobilinogen, UA: 1 (ref 0.0–1.0)
pH: 7 (ref 5.0–8.0)

## 2014-12-08 LAB — BASIC METABOLIC PANEL
BUN: 18 mg/dL (ref 6–23)
CALCIUM: 9.3 mg/dL (ref 8.4–10.5)
CO2: 29 mEq/L (ref 19–32)
CREATININE: 1.18 mg/dL (ref 0.40–1.50)
Chloride: 104 mEq/L (ref 96–112)
GFR: 88.09 mL/min (ref >60.00)
GLUCOSE: 89 mg/dL (ref 70–99)
Potassium: 3.9 mEq/L (ref 3.5–5.1)
SODIUM: 136 meq/L (ref 135–145)

## 2014-12-08 LAB — TSH: TSH: 1.02 u[IU]/mL (ref 0.35–4.50)

## 2014-12-08 NOTE — Patient Instructions (Addendum)
You had the flu shot today  Your EKG was [facility] today  Please continue all other medications as before, and refills have been done if requested.  Please have the pharmacy call with any other refills you may need.  Please continue your efforts at being more active, low cholesterol diet, and weight control.  You are otherwise up to date with prevention measures today.  Please keep your appointments with your specialists as you may have planned  Please go to the LAB in the Basement (turn left off the elevator) for the tests to be done today  You will be contacted by phone if any changes need to be made immediately.  Otherwise, you will receive a letter about your results with an explanation, but please check with MyChart first.  Please remember to sign up for MyChart if you have not done so, as this will be important to you in the future with finding out test results, communicating by private email, and scheduling acute appointments online when needed.  Please return in 1 year for your yearly visit, or sooner if needed, with Lab testing done 3-5 days before

## 2014-12-08 NOTE — Progress Notes (Signed)
Pre visit review using our clinic review tool, if applicable. No additional management support is needed unless otherwise documented below in the visit note. 

## 2014-12-08 NOTE — Progress Notes (Signed)
Subjective:    Patient ID: Andrew Bailey, male    DOB: Oct 03, 1976, 38 y.o.   MRN: [id number]  HPI  Here for wellness and f/u;  Overall doing ok;  Pt denies CP, worsening SOB, DOE, wheezing, orthopnea, PND, worsening LE edema, palpitations, dizziness or syncope.  Pt denies neurological change such as new headache, facial or extremity weakness.  Pt denies polydipsia, polyuria, or low sugar symptoms. Pt states overall good compliance with treatment and medications, good tolerability, and has been trying to follow lower cholesterol diet.  Pt denies worsening depressive symptoms, suicidal ideation or panic. No fever, night sweats, wt loss, loss of appetite, or other constitutional symptoms.  Pt states good ability with ADL's, has low fall risk, home safety reviewed and adequate, no other significant changes in hearing or vision, and occasionally active with exercise, mostly situps and wts at home Past Medical History  Diagnosis Date  . Allergic rhinitis, cause unspecified   . Allergic conjunctivitis of both eyes 01/29/2012  . Asthma   . Allergy    History reviewed. No pertinent past surgical history.  reports that he has never smoked. He has never used smokeless tobacco. He reports that he does not drink alcohol or use illicit drugs. family history includes Cancer in his other; Diabetes in his father, mother, other, and other; Heart disease in his father and other; Hypertension in his sister. Allergies  Allergen Reactions  . Fish Allergy Itching    Top of mouth  . Other Other (See Comments)    Grass causes seasonal allergies   Current Outpatient Prescriptions on File Prior to Visit  Medication Sig Dispense Refill  . clotrimazole-betamethasone (LOTRISONE) cream Use as directed twice per day as needed 15 g 1  . fluticasone (FLONASE) 50 MCG/ACT nasal spray Place 2 sprays into both nostrils daily. 16 g 5  . ibuprofen (ADVIL,MOTRIN) 400 MG tablet Take 400 mg by mouth every 6 (six) hours as  needed for pain.    Marland Kitchen loratadine (CLARITIN) 10 MG tablet Take 10 mg by mouth daily.    . fexofenadine (ALLEGRA) 180 MG tablet Take 1 tablet (180 mg total) by mouth daily. 30 tablet 11   No current facility-administered medications on file prior to visit.    Review of Systems Constitutional: Negative for increased diaphoresis, other activity, appetite or other siginficant weight change  HENT: Negative for worsening hearing loss, ear pain, facial swelling, mouth sores and neck stiffness.   Eyes: Negative for other worsening pain, redness or visual disturbance.  Respiratory: Negative for shortness of breath and wheezing.   Cardiovascular: Negative for chest pain and palpitations.  Gastrointestinal: Negative for diarrhea, blood in stool, abdominal distention or other pain Genitourinary: Negative for hematuria, flank pain or change in urine volume.  Musculoskeletal: Negative for myalgias or other joint complaints.  Skin: Negative for color change and wound.  Neurological: Negative for syncope and numbness. other than noted Hematological: Negative for adenopathy. or other swelling Psychiatric/Behavioral: Negative for hallucinations, self-injury, decreased concentration or other worsening agitation.      Objective:   Physical Exam BP 118/84 mmHg  Pulse 85  Temp(Src) 98.6 F (37 C) (Oral)  Ht 5\' 5"  (1.651 m)  Wt 174 lb (78.926 kg)  BMI 28.96 kg/m2  SpO2 96% VS noted,  Constitutional: Pt is oriented to person, place, and time. Appears well-developed and well-nourished.  Head: Normocephalic and atraumatic.  Right Ear: External ear normal.  Left Ear: External ear normal.  Nose: Nose normal.  Mouth/Throat:  Oropharynx is clear and moist.  Eyes: Conjunctivae and EOM are normal. Pupils are equal, round, and reactive to light.  Neck: Normal range of motion. Neck supple. No JVD present. No tracheal deviation present.  Cardiovascular: Normal rate, regular rhythm, normal heart sounds and intact  distal pulses.   Pulmonary/Chest: Effort normal and breath sounds without rales or wheezing  Abdominal: Soft. Bowel sounds are normal. NT. No HSM  Musculoskeletal: Normal range of motion. Exhibits no edema.  Lymphadenopathy:  Has no cervical adenopathy.  Neurological: Pt is alert and oriented to person, place, and time. Pt has normal reflexes. No cranial nerve deficit. Motor grossly intact Skin: Skin is warm and dry. No rash noted.  Psychiatric:  Has normal mood and affect. Behavior is normal.     Assessment & Plan:

## 2014-12-08 NOTE — Assessment & Plan Note (Signed)

## 2014-12-08 NOTE — Addendum Note (Signed)
Addended by: Valerie Salts on: 12/08/2014 03:42 PM   Modules accepted: Orders

## 2015-01-05 ENCOUNTER — Telehealth: Payer: Self-pay

## 2015-01-05 NOTE — Telephone Encounter (Signed)
Patient returned call. No one was available to take call.

## 2015-01-05 NOTE — Telephone Encounter (Addendum)
Pt would like to speak with someone regarding his diagnosis about his throat , He is still hoarse and wanted to know what medicines he was given and if we gave him a shot Please call [phone]

## 2015-01-05 NOTE — Telephone Encounter (Signed)
Advised him in October he was here for strep throat/ had injection.

## 2015-01-05 NOTE — Telephone Encounter (Signed)
Called pt no answer and voicemail is full.

## 2015-11-29 ENCOUNTER — Ambulatory Visit (INDEPENDENT_AMBULATORY_CARE_PROVIDER_SITE_OTHER)
Admission: RE | Admit: 2015-11-29 | Discharge: 2015-11-29 | Disposition: A | Discharge status: Home / Self Care | Payer: BLUE CROSS/BLUE SHIELD | Source: Ambulatory Visit | Attending: Internal Medicine | Admitting: Internal Medicine

## 2015-11-29 ENCOUNTER — Ambulatory Visit (INDEPENDENT_AMBULATORY_CARE_PROVIDER_SITE_OTHER): Payer: BLUE CROSS/BLUE SHIELD | Admitting: Internal Medicine

## 2015-11-29 ENCOUNTER — Encounter: Payer: Self-pay | Admitting: Internal Medicine

## 2015-11-29 VITALS — BP 130/84 | HR 75 | Temp 98.3°F | Resp 20 | Wt 183.0 lb

## 2015-11-29 DIAGNOSIS — R079 Chest pain, unspecified: Secondary | ICD-10-CM | POA: Diagnosis not present

## 2015-11-29 DIAGNOSIS — M546 Pain in thoracic spine: Secondary | ICD-10-CM

## 2015-11-29 DIAGNOSIS — M545 Low back pain, unspecified: Secondary | ICD-10-CM | POA: Insufficient documentation

## 2015-11-29 DIAGNOSIS — M549 Dorsalgia, unspecified: Secondary | ICD-10-CM

## 2015-11-29 DIAGNOSIS — Z0189 Encounter for other specified special examinations: Secondary | ICD-10-CM | POA: Diagnosis not present

## 2015-11-29 DIAGNOSIS — Z0000 Encounter for general adult medical examination without abnormal findings: Secondary | ICD-10-CM

## 2015-11-29 MED ORDER — DICLOFENAC SODIUM 75 MG PO TBEC
75.0000 mg | DELAYED_RELEASE_TABLET | Freq: Two times a day (BID) | ORAL | Status: DC | PRN
Start: 2015-11-29 — End: 2016-12-21

## 2015-11-29 MED ORDER — CYCLOBENZAPRINE HCL 5 MG PO TABS
5.0000 mg | ORAL_TABLET | Freq: Three times a day (TID) | ORAL | Status: DC | PRN
Start: 2015-11-29 — End: 2016-12-21

## 2015-11-29 NOTE — Progress Notes (Signed)
Pre visit review using our clinic review tool, if applicable. No additional management support is needed unless otherwise documented below in the visit note. 

## 2015-11-29 NOTE — Progress Notes (Signed)
Subjective:    Patient ID: Andrew Bailey, male    DOB: [birth year], 40 y.o.   MRN: [id number]  HPI  Here to f/u, has persistent left upper back and right upper chest pain without radiation, sob, n/v, diaphoresis, palp, dizziness or syncope.  + pleuritic and is postional in that it is worse to hold steering wheel with driving, worse to lean forward with shoulder hunched, and remains sore to touch.  Non exertional, No ST, cough, fever, wheezing.  Denies worsening reflux, abd pain, dysphagia, n/v, bowel change or blood.  Does now state had started new excercises recently trying to get back into better shape as he is sedentary as a driver for work.  In last 2 wks, has done 50 pushups most days.  Also,  Pt continues to have recurring midline to left LBP x 1 wk, without bowel or bladder change, fever, wt loss,  worsening LE pain/numbness/weakness, gait change or falls.  Past Medical History  Diagnosis Date  . Allergic rhinitis, cause unspecified   . Allergic conjunctivitis of both eyes 01/29/2012  . Asthma   . Allergy    No past surgical history on file.  reports that he has never smoked. He has never used smokeless tobacco. He reports that he does not drink alcohol or use illicit drugs. family history includes Cancer in his other; Diabetes in his father, mother, other, and other; Heart disease in his father and other; Hypertension in his sister. Allergies  Allergen Reactions  . Fish Allergy Itching    Top of mouth  . Other Other (See Comments)    Grass causes seasonal allergies   Current Outpatient Prescriptions on File Prior to Visit  Medication Sig Dispense Refill  . clotrimazole-betamethasone (LOTRISONE) cream Use as directed twice per day as needed 15 g 1  . fluticasone (FLONASE) 50 MCG/ACT nasal spray Place 2 sprays into both nostrils daily. 16 g 5  . ibuprofen (ADVIL,MOTRIN) 400 MG tablet Take 400 mg by mouth every 6 (six) hours as needed for pain.    Marland Kitchen loratadine (CLARITIN) 10 MG  tablet Take 10 mg by mouth daily.    . fexofenadine (ALLEGRA) 180 MG tablet Take 1 tablet (180 mg total) by mouth daily. 30 tablet 11   No current facility-administered medications on file prior to visit.   Review of Systems  Constitutional: Negative for unusual diaphoresis or night sweats HENT: Negative for ringing in ear or discharge Eyes: Negative for double vision or worsening visual disturbance.  Respiratory: Negative for choking and stridor.   Gastrointestinal: Negative for vomiting or other signifcant bowel change Genitourinary: Negative for hematuria or change in urine volume.  Musculoskeletal: Negative for other MSK pain or swelling Skin: Negative for color change and worsening wound.  Neurological: Negative for tremors and numbness other than noted  Psychiatric/Behavioral: Negative for decreased concentration or agitation other than above       Objective:   Physical Exam BP 130/84 mmHg  Pulse 75  Temp(Src) 98.3 F (36.8 C) (Oral)  Resp 20  Wt 183 lb (83.008 kg)  SpO2 96% VS noted,  Constitutional: Pt appears in no significant distress HENT: Head: NCAT.  Right Ear: External ear normal.  Left Ear: External ear normal.  Eyes: . Pupils are equal, round, and reactive to light. Conjunctivae and EOM are normal Neck: Normal range of motion. Neck supple. NT Cardiovascular: Normal rate and regular rhythm.   Pulmonary/Chest: Effort normal and breath sounds without rales or wheezing.  Abd:  Soft,  NT, ND, + BS Neurological: Pt is alert. Not confused , motor 5/5 intact Skin: Skin is warm. No rash, no LE edema Psychiatric: Pt behavior is normal. No agitation.  Tender areas noted to left trapezoid and post deltoid areas without swelling, rash or other skin change Left shoulder o/w FROM Spine nontender throughout with mild left lumbar paravertebral tender    Assessment & Plan:

## 2015-11-29 NOTE — Patient Instructions (Addendum)
Please take all new medication as prescribed  - the anti-inflammatory for pain, and the muscle relaxer as needed  Please continue all other medications as before, and refills have been done if requested.  Please have the pharmacy call with any other refills you may need.  Please keep your appointments with your specialists as you may have planned  Please go to the XRAY Department in the Basement (go straight as you get off the elevator) for the x-ray testing  You will be contacted by phone if any changes need to be made immediately.  Otherwise, you will receive a letter about your results with an explanation, but please check with MyChart first.  Please remember to sign up for MyChart if you have not done so, as this will be important to you in the future with finding out test results, communicating by private email, and scheduling acute appointments online when needed.

## 2015-11-30 ENCOUNTER — Encounter: Payer: Self-pay | Admitting: Internal Medicine

## 2015-12-01 NOTE — Assessment & Plan Note (Signed)
Atypical, likely related to left upper back and shoulder pain, doubt cardiac or neuritic, will check cxr but most likely msk,  to f/u any worsening symptoms or concerns

## 2015-12-01 NOTE — Assessment & Plan Note (Signed)
Mild onset about same time as recent onset new excercises when previously sedentary in this middle aged person; also for nsaid and muscle relaxer prn as will help with the upper back pain as well,  to f/u any worsening symptoms or concerns

## 2015-12-01 NOTE — Assessment & Plan Note (Signed)
C/w shoulder/trapezoid strain with pushups activity, exam with tenderness but o/w functionally ok, cont same tx, to f/u any worsening symptoms or concerns

## 2015-12-21 ENCOUNTER — Other Ambulatory Visit (INDEPENDENT_AMBULATORY_CARE_PROVIDER_SITE_OTHER): Payer: BLUE CROSS/BLUE SHIELD

## 2015-12-21 ENCOUNTER — Encounter: Payer: Self-pay | Admitting: Internal Medicine

## 2015-12-21 ENCOUNTER — Ambulatory Visit (INDEPENDENT_AMBULATORY_CARE_PROVIDER_SITE_OTHER): Payer: BLUE CROSS/BLUE SHIELD | Admitting: Internal Medicine

## 2015-12-21 VITALS — BP 122/76 | HR 71 | Temp 98.6°F | Resp 20 | Wt 188.0 lb

## 2015-12-21 DIAGNOSIS — J4520 Mild intermittent asthma, uncomplicated: Secondary | ICD-10-CM | POA: Diagnosis not present

## 2015-12-21 DIAGNOSIS — Z0000 Encounter for general adult medical examination without abnormal findings: Secondary | ICD-10-CM

## 2015-12-21 DIAGNOSIS — R21 Rash and other nonspecific skin eruption: Secondary | ICD-10-CM | POA: Diagnosis not present

## 2015-12-21 DIAGNOSIS — J309 Allergic rhinitis, unspecified: Secondary | ICD-10-CM | POA: Diagnosis not present

## 2015-12-21 LAB — CBC WITH DIFFERENTIAL/PLATELET
BASOS ABS: 0 10*3/uL (ref 0.0–0.1)
Basophils Relative: 0.5 % (ref 0.0–3.0)
EOS ABS: 0.3 10*3/uL (ref 0.0–0.7)
Eosinophils Relative: 3.3 % (ref 0.0–5.0)
HCT: 50 % (ref 39.0–52.0)
Hemoglobin: 16.7 g/dL (ref 13.0–17.0)
LYMPHS ABS: 2.8 10*3/uL (ref 0.7–4.0)
Lymphocytes Relative: 35.3 % (ref 12.0–46.0)
MCHC: 33.4 g/dL (ref 30.0–36.0)
MCV: 83.6 fl (ref 78.0–100.0)
Monocytes Absolute: 0.6 10*3/uL (ref 0.1–1.0)
Monocytes Relative: 7.8 % (ref 3.0–12.0)
NEUTROS ABS: 4.2 10*3/uL (ref 1.4–7.7)
NEUTROS PCT: 53.1 % (ref 43.0–77.0)
PLATELETS: 204 10*3/uL (ref 150.0–400.0)
RBC: 5.98 Mil/uL — AB (ref 4.22–5.81)
RDW: 13.6 % (ref 11.5–15.5)
WBC: 7.9 10*3/uL (ref 4.0–10.5)

## 2015-12-21 LAB — BASIC METABOLIC PANEL
BUN: 14 mg/dL (ref 6–23)
CO2: 31 mEq/L (ref 19–32)
CREATININE: 1.14 mg/dL (ref 0.40–1.50)
Calcium: 9.1 mg/dL (ref 8.4–10.5)
Chloride: 102 mEq/L (ref 96–112)
GFR: 91.19 mL/min (ref >60.00)
GLUCOSE: 88 mg/dL (ref 70–99)
Potassium: 4.2 mEq/L (ref 3.5–5.1)
SODIUM: 137 meq/L (ref 135–145)

## 2015-12-21 LAB — LIPID PANEL
Cholesterol: 191 mg/dL (ref 0–200)
HDL: 45.2 mg/dL (ref >39.00)
LDL CALC: 137 mg/dL — AB (ref 0–99)
NONHDL: 146.04
Total CHOL/HDL Ratio: 4
Triglycerides: 43 mg/dL (ref 0.0–149.0)
VLDL: 8.6 mg/dL (ref 0.0–40.0)

## 2015-12-21 LAB — URINALYSIS, ROUTINE W REFLEX MICROSCOPIC
BILIRUBIN URINE: NEGATIVE
Hgb urine dipstick: NEGATIVE
KETONES UR: NEGATIVE
Leukocytes, UA: NEGATIVE
Nitrite: NEGATIVE
PH: 7 (ref 5.0–8.0)
Specific Gravity, Urine: 1.025 (ref 1.000–1.030)
URINE GLUCOSE: NEGATIVE
UROBILINOGEN UA: 0.2 (ref 0.0–1.0)

## 2015-12-21 LAB — HEPATIC FUNCTION PANEL
ALBUMIN: 4.1 g/dL (ref 3.5–5.2)
ALK PHOS: 53 U/L (ref 39–117)
ALT: 80 U/L — AB (ref 0–53)
AST: 43 U/L — ABNORMAL HIGH (ref 0–37)
BILIRUBIN DIRECT: 0.1 mg/dL (ref 0.0–0.3)
TOTAL PROTEIN: 7.2 g/dL (ref 6.0–8.3)
Total Bilirubin: 0.5 mg/dL (ref 0.2–1.2)

## 2015-12-21 LAB — PSA: PSA: 0.67 ng/mL (ref 0.10–4.00)

## 2015-12-21 LAB — TSH: TSH: 1.38 u[IU]/mL (ref 0.35–4.50)

## 2015-12-21 MED ORDER — CLOTRIMAZOLE-BETAMETHASONE 1-0.05 % EX CREA: TOPICAL_CREAM | CUTANEOUS | Status: DC

## 2015-12-21 NOTE — Progress Notes (Signed)
Subjective:    Patient ID: Andrew Bailey, male    DOB: November 19, 1975, 40 y.o.   MRN: [id number]  HPI  Here for wellness and f/u;  Overall doing ok;  Pt denies Chest pain, worsening SOB, DOE, wheezing, orthopnea, PND, worsening LE edema, palpitations, dizziness or syncope.  Pt denies neurological change such as new headache, facial or extremity weakness.  Pt denies polydipsia, polyuria, or low sugar symptoms. Pt states overall good compliance with treatment and medications, good tolerability, and has been trying to follow appropriate diet.  Pt denies worsening depressive symptoms, suicidal ideation or panic. No fever, night sweats, wt loss, loss of appetite, or other constitutional symptoms.  Pt states good ability with ADL's, has low fall risk, home safety reviewed and adequate, no other significant changes in hearing or vision, and only occasionally active with exercise. Left shoulder now improved, but can still feel some pain with driving with the left hand and arm.  Meds seemed to help, including the pain med and muscle relaxer.  Does have mild several spots varied sized up to 1 mm between legs where sweats quite a bit.  Does have several wks ongoing nasal allergy symptoms with clearish congestion, itch and sneezing, without fever, pain, ST, cough, swelling or wheezing. Past Medical History  Diagnosis Date  . Allergic rhinitis, cause unspecified   . Allergic conjunctivitis of both eyes 01/29/2012  . Asthma   . Allergy    No past surgical history on file.  reports that he has never smoked. He has never used smokeless tobacco. He reports that he does not drink alcohol or use illicit drugs. family history includes Cancer in his other; Diabetes in his father, mother, other, and other; Heart disease in his father and other; Hypertension in his sister. Allergies  Allergen Reactions  . Fish Allergy Itching    Top of mouth  . Other Other (See Comments)    Grass causes seasonal allergies   Current  Outpatient Prescriptions on File Prior to Visit  Medication Sig Dispense Refill  . clotrimazole-betamethasone (LOTRISONE) cream Use as directed twice per day as needed 15 g 1  . cyclobenzaprine (FLEXERIL) 5 MG tablet Take 1 tablet (5 mg total) by mouth 3 (three) times daily as needed for muscle spasms. 60 tablet 1  . diclofenac (VOLTAREN) 75 MG EC tablet Take 1 tablet (75 mg total) by mouth 2 (two) times daily as needed. 60 tablet 1  . fluticasone (FLONASE) 50 MCG/ACT nasal spray Place 2 sprays into both nostrils daily. 16 g 5  . ibuprofen (ADVIL,MOTRIN) 400 MG tablet Take 400 mg by mouth every 6 (six) hours as needed for pain.    Marland Kitchen loratadine (CLARITIN) 10 MG tablet Take 10 mg by mouth daily.    . fexofenadine (ALLEGRA) 180 MG tablet Take 1 tablet (180 mg total) by mouth daily. 30 tablet 11   No current facility-administered medications on file prior to visit.     Review of Systems Constitutional: Negative for increased diaphoresis, other activity, appetite or siginficant weight change other than noted HENT: Negative for worsening hearing loss, ear pain, facial swelling, mouth sores and neck stiffness.   Eyes: Negative for other worsening pain, redness or visual disturbance.  Respiratory: Negative for shortness of breath and wheezing  Cardiovascular: Negative for chest pain and palpitations.  Gastrointestinal: Negative for diarrhea, blood in stool, abdominal distention or other pain Genitourinary: Negative for hematuria, flank pain or change in urine volume.  Musculoskeletal: Negative for myalgias or other  joint complaints.  Skin: Negative for color change and wound or drainage.  Neurological: Negative for syncope and numbness. other than noted Hematological: Negative for adenopathy. or other swelling Psychiatric/Behavioral: Negative for hallucinations, SI, self-injury, decreased concentration or other worsening agitation.      Objective:   Physical Exam BP 122/76 mmHg  Pulse 71   Temp(Src) 98.6 F (37 C) (Oral)  Resp 20  Wt 188 lb (85.276 kg)  SpO2 96% VS noted,  Constitutional: Pt is oriented to person, place, and time. Appears well-developed and well-nourished, in no significant distress Head: Normocephalic and atraumatic.  Right Ear: External ear normal.  Left Ear: External ear normal.  Nose: Nose normal.  Mouth/Throat: Oropharynx is clear and moist.  Bilat tm's with mild erythema.  Max sinus areas non tender.  Pharynx with mild erythema, no exudate Eyes: Conjunctivae and EOM are normal. Pupils are equal, round, and reactive to light.  Neck: Normal range of motion. Neck supple. No JVD present. No tracheal deviation present or significant neck LA or mass Cardiovascular: Normal rate, regular rhythm, normal heart sounds and intact distal pulses.   Pulmonary/Chest: Effort normal and breath sounds without rales or wheezing  Abdominal: Soft. Bowel sounds are normal. NT. No HSM  Musculoskeletal: Normal range of motion. Exhibits no edema.  Lymphadenopathy:  Has no cervical adenopathy.  Neurological: Pt is alert and oriented to person, place, and time. Pt has normal reflexes. No cranial nerve deficit. Motor grossly intact Skin: Skin is warm and dry. + rash noted with several areas non raised erythem slightly scaly lesions to medial legs, nontender  Psychiatric:  Has normal mood and affect. Behavior is normal.     Assessment & Plan:

## 2015-12-21 NOTE — Progress Notes (Signed)
Pre visit review using our clinic review tool, if applicable. No additional management support is needed unless otherwise documented below in the visit note. 

## 2015-12-21 NOTE — Patient Instructions (Signed)
Please take all new medication as prescribed - the cream  Please continue all other medications as before, and refills have been done if requested.  Please have the pharmacy call with any other refills you may need.  Please continue your efforts at being more active, low cholesterol diet, and weight control.  You are otherwise up to date with prevention measures today.  Please keep your appointments with your specialists as you may have planned  Please go to the LAB in the Basement (turn left off the elevator) for the tests to be done today  You will be contacted by phone if any changes need to be made immediately.  Otherwise, you will receive a letter about your results with an explanation, but please check with MyChart first.  Please remember to sign up for MyChart if you have not done so, as this will be important to you in the future with finding out test results, communicating by private email, and scheduling acute appointments online when needed.  Please return in 1 year for your yearly visit, or sooner if needed, with Lab testing done 3-5 days before  

## 2015-12-24 NOTE — Assessment & Plan Note (Signed)
?   Fungal related, for lotrisone asd prn,  to f/u any worsening symptoms or concerns

## 2015-12-24 NOTE — Assessment & Plan Note (Signed)
stable overall by history and exam, recent data reviewed with pt, and pt to continue medical treatment as before,  to f/u any worsening symptoms or concerns SpO2 Readings from Last 3 Encounters:  12/21/15 96%  11/29/15 96%  12/08/14 96%

## 2015-12-24 NOTE — Assessment & Plan Note (Signed)

## 2015-12-24 NOTE — Assessment & Plan Note (Signed)
Mild to mod seasonal flare, for restart nasal steroid,  to f/u any worsening symptoms or concerns

## 2016-12-21 ENCOUNTER — Other Ambulatory Visit (INDEPENDENT_AMBULATORY_CARE_PROVIDER_SITE_OTHER): Payer: BLUE CROSS/BLUE SHIELD

## 2016-12-21 ENCOUNTER — Ambulatory Visit (INDEPENDENT_AMBULATORY_CARE_PROVIDER_SITE_OTHER): Payer: BLUE CROSS/BLUE SHIELD | Admitting: Internal Medicine

## 2016-12-21 ENCOUNTER — Encounter: Payer: Self-pay | Admitting: Internal Medicine

## 2016-12-21 VITALS — BP 138/82 | HR 74 | Temp 98.6°F | Ht 65.0 in | Wt 184.0 lb

## 2016-12-21 DIAGNOSIS — M25519 Pain in unspecified shoulder: Secondary | ICD-10-CM

## 2016-12-21 DIAGNOSIS — Z0000 Encounter for general adult medical examination without abnormal findings: Secondary | ICD-10-CM | POA: Diagnosis not present

## 2016-12-21 DIAGNOSIS — J309 Allergic rhinitis, unspecified: Secondary | ICD-10-CM

## 2016-12-21 LAB — URINALYSIS, ROUTINE W REFLEX MICROSCOPIC
BILIRUBIN URINE: NEGATIVE
HGB URINE DIPSTICK: NEGATIVE
Ketones, ur: NEGATIVE
Leukocytes, UA: NEGATIVE
NITRITE: NEGATIVE
Specific Gravity, Urine: 1.02 (ref 1.000–1.030)
Total Protein, Urine: NEGATIVE
UROBILINOGEN UA: 2 — AB (ref 0.0–1.0)
Urine Glucose: NEGATIVE
pH: 6.5 (ref 5.0–8.0)

## 2016-12-21 LAB — BASIC METABOLIC PANEL
BUN: 19 mg/dL (ref 6–23)
CALCIUM: 9.5 mg/dL (ref 8.4–10.5)
CHLORIDE: 102 meq/L (ref 96–112)
CO2: 28 mEq/L (ref 19–32)
CREATININE: 1.17 mg/dL (ref 0.40–1.50)
GFR: 88.06 mL/min (ref >60.00)
Glucose, Bld: 106 mg/dL — ABNORMAL HIGH (ref 70–99)
Potassium: 3.8 mEq/L (ref 3.5–5.1)
Sodium: 135 mEq/L (ref 135–145)

## 2016-12-21 LAB — CBC WITH DIFFERENTIAL/PLATELET
BASOS PCT: 0.5 % (ref 0.0–3.0)
Basophils Absolute: 0 10*3/uL (ref 0.0–0.1)
EOS PCT: 2.3 % (ref 0.0–5.0)
Eosinophils Absolute: 0.2 10*3/uL (ref 0.0–0.7)
HEMATOCRIT: 51.2 % (ref 39.0–52.0)
HEMOGLOBIN: 16.9 g/dL (ref 13.0–17.0)
LYMPHS PCT: 30 % (ref 12.0–46.0)
Lymphs Abs: 2.3 10*3/uL (ref 0.7–4.0)
MCHC: 32.9 g/dL (ref 30.0–36.0)
MCV: 84.4 fl (ref 78.0–100.0)
MONOS PCT: 9.5 % (ref 3.0–12.0)
Monocytes Absolute: 0.7 10*3/uL (ref 0.1–1.0)
NEUTROS ABS: 4.5 10*3/uL (ref 1.4–7.7)
Neutrophils Relative %: 57.7 % (ref 43.0–77.0)
PLATELETS: 205 10*3/uL (ref 150.0–400.0)
RBC: 6.07 Mil/uL — ABNORMAL HIGH (ref 4.22–5.81)
RDW: 13.3 % (ref 11.5–15.5)
WBC: 7.7 10*3/uL (ref 4.0–10.5)

## 2016-12-21 LAB — HEPATIC FUNCTION PANEL
ALT: 36 U/L (ref 0–53)
AST: 28 U/L (ref 0–37)
Albumin: 4.2 g/dL (ref 3.5–5.2)
Alkaline Phosphatase: 63 U/L (ref 39–117)
BILIRUBIN DIRECT: 0.1 mg/dL (ref 0.0–0.3)
BILIRUBIN TOTAL: 0.6 mg/dL (ref 0.2–1.2)
Total Protein: 7.1 g/dL (ref 6.0–8.3)

## 2016-12-21 LAB — TSH: TSH: 1.76 u[IU]/mL (ref 0.35–4.50)

## 2016-12-21 LAB — LIPID PANEL
CHOL/HDL RATIO: 4
Cholesterol: 191 mg/dL (ref 0–200)
HDL: 44.8 mg/dL (ref >39.00)
LDL CALC: 130 mg/dL — AB (ref 0–99)
NONHDL: 146.48
Triglycerides: 81 mg/dL (ref 0.0–149.0)
VLDL: 16.2 mg/dL (ref 0.0–40.0)

## 2016-12-21 LAB — PSA: PSA: 0.72 ng/mL (ref 0.10–4.00)

## 2016-12-21 MED ORDER — AZELASTINE HCL 0.05 % OP SOLN: 1.0000 [drp] | Freq: Two times a day (BID) | OPHTHALMIC | 12 refills | Status: DC

## 2016-12-21 MED ORDER — TRIAMCINOLONE ACETONIDE 55 MCG/ACT NA AERO: 2.0000 | INHALATION_SPRAY | Freq: Every day | NASAL | 12 refills | Status: DC

## 2016-12-21 MED ORDER — METHYLPREDNISOLONE ACETATE 80 MG/ML IJ SUSP
80.0000 mg | Freq: Once | INTRAMUSCULAR | Status: AC
  Administered 2016-12-21: 80 mg via INTRAMUSCULAR

## 2016-12-21 NOTE — Patient Instructions (Signed)
Please take all new medication as prescribed - the optivar and nasacort for the allergies  Please continue all other medications as before, and refills have been done if requested.  Please have the pharmacy call with any other refills you may need.  Please continue your efforts at being more active, low cholesterol diet, and weight control.  You are otherwise up to date with prevention measures today.  Please keep your appointments with your specialists as you may have planned  Please consider making an Appt with Dr Tamala Julian for your right shoulder as you leave  Please go to the LAB in the Basement (turn left off the elevator) for the tests to be done today  You will be contacted by phone if any changes need to be made immediately.  Otherwise, you will receive a letter about your results with an explanation, but please check with MyChart first.  Please remember to sign up for MyChart if you have not done so, as this will be important to you in the future with finding out test results, communicating by private email, and scheduling acute appointments online when needed.  Please return in 1 year for your yearly visit, or sooner if needed, with Lab testing done 3-5 days before

## 2016-12-21 NOTE — Progress Notes (Signed)
Subjective:    Patient ID: Andrew Bailey, male    DOB: Feb 26, 1976, 41 y.o.   MRN: [id number]  HPI  Here for wellness and f/u;  Overall doing ok;  Pt denies Chest pain, worsening SOB, DOE, wheezing, orthopnea, PND, worsening LE edema, palpitations, dizziness or syncope.  Pt denies neurological change such as new headache, facial or extremity weakness.  Pt denies polydipsia, polyuria, or low sugar symptoms. Pt states overall good compliance with treatment and medications, good tolerability, and has been trying to follow appropriate diet.  Pt denies worsening depressive symptoms, suicidal ideation or panic. No fever, night sweats, wt loss, loss of appetite, or other constitutional symptoms.  Pt states good ability with ADL's, has low fall risk, home safety reviewed and adequate, no other significant changes in hearing or vision, and only occasionally active with exercise. 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 Does have several wks ongoing mod bilat eye and milder nasal allergy symptoms with clearish congestion, itch and sneezing, without fever, pain, ST, cough, swelling or wheezing.  Past Medical History:  Diagnosis Date  . Allergic conjunctivitis of both eyes 01/29/2012  . Allergic rhinitis, cause unspecified   . Allergy   . Asthma    History reviewed. No pertinent surgical history.  reports that he has never smoked. He has never used smokeless tobacco. He reports that he does not drink alcohol or use drugs. family history includes Cancer in his other; Diabetes in his father, mother, other, and other; Heart disease in his father and other; Hypertension in his sister. Allergies  Allergen Reactions  . Fish Allergy Itching    Top of mouth  . Other  Other (See Comments)    Grass causes seasonal allergies   Current Outpatient Prescriptions on File Prior to Visit  Medication Sig Dispense Refill  . clotrimazole-betamethasone (LOTRISONE) cream Use as directed twice per day as needed 15 g 1  . ibuprofen (ADVIL,MOTRIN) 400 MG tablet Take 400 mg by mouth every 6 (six) hours as needed for pain.    . fexofenadine (ALLEGRA) 180 MG tablet Take 1 tablet (180 mg total) by mouth daily. 30 tablet 11   No current facility-administered medications on file prior to visit.    Review of Systems Constitutional: Negative for increased diaphoresis, or other activity, appetite or siginficant weight change other than noted HENT: Negative for worsening hearing loss, ear pain, facial swelling, mouth sores and neck stiffness.   Eyes: Negative for other worsening pain, redness or visual disturbance.  Respiratory: Negative for choking or stridor Cardiovascular: Negative for other chest pain and palpitations.  Gastrointestinal: Negative for worsening diarrhea, blood in stool, or abdominal distention Genitourinary: Negative for hematuria, flank pain or change in urine volume.  Musculoskeletal: Negative for myalgias or other joint complaints.  Skin: Negative for other color change and wound or drainage.  Neurological: Negative for syncope and numbness. other than noted Hematological: Negative for adenopathy. or other swelling Psychiatric/Behavioral: Negative for hallucinations, SI, self-injury, decreased concentration or other worsening agitation.  All other system neg per pt    Objective:   Physical Exam BP 138/82   Pulse 74   Temp 98.6 F (37 C)   Ht 5\' 5"  (1.651 m)  Wt 184 lb (83.5 kg)   SpO2 98%   BMI 30.62 kg/m  VS noted,  Constitutional: Pt is oriented to person, place, and time. Appears well-developed and well-nourished, in no significant distress Head: Normocephalic and atraumatic  Eyes: Conjunctivae with bilateral conjunctival erythema with  clearish yellow d/c and EOM are normal. Pupils are equal, round, and reactive to light Right Ear: External ear normal.  Left Ear: External ear normal Nose: Nose normal.  Mouth/Throat: Oropharynx is clear and moist  Neck: Normal range of motion. Neck supple. No JVD present. No tracheal deviation present or significant neck LA or mass Cardiovascular: Normal rate, regular rhythm, normal heart sounds and intact distal pulses.   Pulmonary/Chest: Effort normal and breath sounds without rales or wheezing  Abdominal: Soft. Bowel sounds are normal. NT. No HSM  Musculoskeletal: Normal range of motion. Exhibits no edema. c/o pain with moving right shoulder across the body, joint NT to palpate Lymphadenopathy: Has no cervical adenopathy.  Neurological: Pt is alert and oriented to person, place, and time. Pt has normal reflexes. No cranial nerve deficit. Motor grossly intact Skin: Skin is warm and dry. No rash noted or new ulcers Psychiatric:  Has normal mood and affect. Behavior is normal.  No other exam findings  ECG today I have personally intepreted Sinus  Rhythm  WITHIN NORMAL LIMITS    Assessment & Plan:

## 2016-12-22 DIAGNOSIS — M25519 Pain in unspecified shoulder: Secondary | ICD-10-CM | POA: Insufficient documentation

## 2016-12-22 NOTE — Assessment & Plan Note (Signed)

## 2016-12-22 NOTE — Assessment & Plan Note (Signed)
With bilat conjunctival erythema, c/w allergic, for optimar and nasacort asd,  to f/u any worsening symptoms or concerns

## 2016-12-22 NOTE — Assessment & Plan Note (Signed)
For f/u with Dr Tamala Julian, sport med, tylenol prn

## 2017-01-12 DIAGNOSIS — J029 Acute pharyngitis, unspecified: Secondary | ICD-10-CM | POA: Diagnosis not present

## 2017-01-12 DIAGNOSIS — R030 Elevated blood-pressure reading, without diagnosis of hypertension: Secondary | ICD-10-CM | POA: Diagnosis not present

## 2017-10-20 DIAGNOSIS — S60042A Contusion of left ring finger without damage to nail, initial encounter: Secondary | ICD-10-CM | POA: Diagnosis not present

## 2017-10-20 DIAGNOSIS — S61215A Laceration without foreign body of left ring finger without damage to nail, initial encounter: Secondary | ICD-10-CM | POA: Diagnosis not present

## 2017-12-19 DIAGNOSIS — J209 Acute bronchitis, unspecified: Secondary | ICD-10-CM | POA: Diagnosis not present

## 2017-12-19 DIAGNOSIS — R6889 Other general symptoms and signs: Secondary | ICD-10-CM | POA: Diagnosis not present

## 2017-12-19 DIAGNOSIS — Z20828 Contact with and (suspected) exposure to other viral communicable diseases: Secondary | ICD-10-CM | POA: Diagnosis not present

## 2017-12-24 ENCOUNTER — Encounter: Payer: BLUE CROSS/BLUE SHIELD | Admitting: Internal Medicine

## 2018-01-10 ENCOUNTER — Encounter: Payer: Self-pay | Admitting: Internal Medicine

## 2018-01-10 ENCOUNTER — Other Ambulatory Visit (INDEPENDENT_AMBULATORY_CARE_PROVIDER_SITE_OTHER): Payer: BLUE CROSS/BLUE SHIELD

## 2018-01-10 ENCOUNTER — Ambulatory Visit (INDEPENDENT_AMBULATORY_CARE_PROVIDER_SITE_OTHER): Payer: BLUE CROSS/BLUE SHIELD | Admitting: Internal Medicine

## 2018-01-10 VITALS — BP 126/84 | HR 70 | Temp 97.6°F | Ht 65.0 in | Wt 181.0 lb

## 2018-01-10 DIAGNOSIS — R21 Rash and other nonspecific skin eruption: Secondary | ICD-10-CM | POA: Diagnosis not present

## 2018-01-10 DIAGNOSIS — Z0000 Encounter for general adult medical examination without abnormal findings: Secondary | ICD-10-CM

## 2018-01-10 LAB — HEPATIC FUNCTION PANEL
ALT: 43 U/L (ref 0–53)
AST: 28 U/L (ref 0–37)
Albumin: 4.1 g/dL (ref 3.5–5.2)
Alkaline Phosphatase: 57 U/L (ref 39–117)
BILIRUBIN DIRECT: 0.1 mg/dL (ref 0.0–0.3)
BILIRUBIN TOTAL: 0.5 mg/dL (ref 0.2–1.2)
Total Protein: 6.9 g/dL (ref 6.0–8.3)

## 2018-01-10 LAB — BASIC METABOLIC PANEL
BUN: 18 mg/dL (ref 6–23)
CO2: 30 meq/L (ref 19–32)
Calcium: 9.2 mg/dL (ref 8.4–10.5)
Chloride: 103 mEq/L (ref 96–112)
Creatinine, Ser: 1.16 mg/dL (ref 0.40–1.50)
GFR: 88.48 mL/min (ref >60.00)
Glucose, Bld: 82 mg/dL (ref 70–99)
POTASSIUM: 4.1 meq/L (ref 3.5–5.1)
SODIUM: 139 meq/L (ref 135–145)

## 2018-01-10 LAB — CBC WITH DIFFERENTIAL/PLATELET
Basophils Absolute: 0 10*3/uL (ref 0.0–0.1)
Basophils Relative: 0.7 % (ref 0.0–3.0)
EOS ABS: 0.3 10*3/uL (ref 0.0–0.7)
Eosinophils Relative: 4.7 % (ref 0.0–5.0)
HCT: 50.2 % (ref 39.0–52.0)
HEMOGLOBIN: 16.8 g/dL (ref 13.0–17.0)
LYMPHS ABS: 2.8 10*3/uL (ref 0.7–4.0)
Lymphocytes Relative: 43.4 % (ref 12.0–46.0)
MCHC: 33.4 g/dL (ref 30.0–36.0)
MCV: 84.3 fl (ref 78.0–100.0)
Monocytes Absolute: 0.6 10*3/uL (ref 0.1–1.0)
Monocytes Relative: 10.2 % (ref 3.0–12.0)
NEUTROS PCT: 41 % — AB (ref 43.0–77.0)
Neutro Abs: 2.6 10*3/uL (ref 1.4–7.7)
Platelets: 197 10*3/uL (ref 150.0–400.0)
RBC: 5.95 Mil/uL — AB (ref 4.22–5.81)
RDW: 13.7 % (ref 11.5–15.5)
WBC: 6.3 10*3/uL (ref 4.0–10.5)

## 2018-01-10 LAB — LIPID PANEL
CHOL/HDL RATIO: 4
Cholesterol: 180 mg/dL (ref 0–200)
HDL: 41.9 mg/dL (ref >39.00)
LDL Cholesterol: 118 mg/dL — ABNORMAL HIGH (ref 0–99)
NONHDL: 137.86
Triglycerides: 97 mg/dL (ref 0.0–149.0)
VLDL: 19.4 mg/dL (ref 0.0–40.0)

## 2018-01-10 LAB — URINALYSIS, ROUTINE W REFLEX MICROSCOPIC
Bilirubin Urine: NEGATIVE
Ketones, ur: NEGATIVE
Leukocytes, UA: NEGATIVE
NITRITE: NEGATIVE
PH: 7 (ref 5.0–8.0)
SPECIFIC GRAVITY, URINE: 1.02 (ref 1.000–1.030)
Total Protein, Urine: NEGATIVE
Urine Glucose: NEGATIVE
Urobilinogen, UA: 1 (ref 0.0–1.0)

## 2018-01-10 LAB — PSA: PSA: 0.81 ng/mL (ref 0.10–4.00)

## 2018-01-10 LAB — TSH: TSH: 1.51 u[IU]/mL (ref 0.35–4.50)

## 2018-01-10 MED ORDER — CLOTRIMAZOLE-BETAMETHASONE 1-0.05 % EX CREA: TOPICAL_CREAM | CUTANEOUS | 1 refills | Status: DC

## 2018-01-10 NOTE — Patient Instructions (Signed)
Please take all new medication as prescribed - the lotrisone cream  Please continue all other medications as before, and refills have been done if requested.  Please have the pharmacy call with any other refills you may need.  Please continue your efforts at being more active, low cholesterol diet, and weight control.  You are otherwise up to date with prevention measures today.  Please keep your appointments with your specialists as you may have planned  Please go to the LAB in the Basement (turn left off the elevator) for the tests to be done today  You will be contacted by phone if any changes need to be made immediately.  Otherwise, you will receive a letter about your results with an explanation, but please check with MyChart first.  Please remember to sign up for MyChart if you have not done so, as this will be important to you in the future with finding out test results, communicating by private email, and scheduling acute appointments online when needed.  Please return in 1 year for your yearly visit, or sooner if needed, with Lab testing done 3-5 days before

## 2018-01-10 NOTE — Progress Notes (Signed)
Subjective:    Patient ID: Andrew Bailey, male    DOB: [birth year], 42 y.o.   MRN: [id number]  HPI   Here for wellness and f/u;  Overall doing ok;  Pt denies Chest pain, worsening SOB, DOE, wheezing, orthopnea, PND, worsening LE edema, palpitations, dizziness or syncope.  Pt denies neurological change such as new headache, facial or extremity weakness.  Pt denies polydipsia, polyuria, or low sugar symptoms. Pt states overall good compliance with treatment and medications, good tolerability, and has been trying to follow appropriate diet.  Pt denies worsening depressive symptoms, suicidal ideation or panic. No fever, night sweats, wt loss, loss of appetite, or other constitutional symptoms.  Pt states good ability with ADL's, has low fall risk, home safety reviewed and adequate, no other significant changes in hearing or vision, and only occasionally active with exercise.  Had recent cough and wheezing 2 wks ago, seen at [facility] tx with zpack and inhaler, now resolved.  Also recent illness with family + for flu, better with tamiflu.  Also has rash to back of upper legs without itching or pain, and works full time as driver seated in the truck Past Medical History:  Diagnosis Date  . Allergic conjunctivitis of both eyes 01/29/2012  . Allergic rhinitis, cause unspecified   . Allergy   . Asthma    No past surgical history on file.  reports that he has never smoked. He has never used smokeless tobacco. He reports that he does not drink alcohol or use drugs. family history includes Cancer in his other; Diabetes in his father, mother, other, and other; Heart disease in his father and other; Hypertension in his sister. Allergies  Allergen Reactions  . Fish Allergy Itching    Top of mouth  . Other Other (See Comments)    Grass causes seasonal allergies   Current Outpatient Medications on File Prior to Visit  Medication Sig Dispense Refill  . azelastine (OPTIVAR) 0.05 % ophthalmic solution Place 1 drop  into both eyes 2 (two) times daily. 6 mL 12  . ibuprofen (ADVIL,MOTRIN) 400 MG tablet Take 400 mg by mouth every 6 (six) hours as needed for pain.    Marland Kitchen triamcinolone (NASACORT AQ) 55 MCG/ACT AERO nasal inhaler Place 2 sprays into the nose daily. 1 Inhaler 12  . fexofenadine (ALLEGRA) 180 MG tablet Take 1 tablet (180 mg total) by mouth daily. 30 tablet 11   No current facility-administered medications on file prior to visit.    Review of Systems Constitutional: Negative for other unusual diaphoresis, sweats, appetite or weight changes HENT: Negative for other worsening hearing loss, ear pain, facial swelling, mouth sores or neck stiffness.   Eyes: Negative for other worsening pain, redness or other visual disturbance.  Respiratory: Negative for other stridor or swelling Cardiovascular: Negative for other palpitations or other chest pain  Gastrointestinal: Negative for worsening diarrhea or loose stools, blood in stool, distention or other pain Genitourinary: Negative for hematuria, flank pain or other change in urine volume.  Musculoskeletal: Negative for myalgias or other joint swelling.  Skin: Negative for other color change, or other wound or worsening drainage.  Neurological: Negative for other syncope or numbness. Hematological: Negative for other adenopathy or swelling Psychiatric/Behavioral: Negative for hallucinations, other worsening agitation, SI, self-injury, or new decreased concentration All other system neg per pt    Objective:   Physical Exam BP 126/84   Pulse 70   Temp 97.6 F (36.4 C) (Oral)   Ht 5\' 5"  (1.651  m)   Wt 181 lb (82.1 kg)   SpO2 97%   BMI 30.12 kg/m  VS noted,  Constitutional: Pt is oriented to person, place, and time. Appears well-developed and well-nourished, in no significant distress and comfortable Head: Normocephalic and atraumatic  Eyes: Conjunctivae and EOM are normal. Pupils are equal, round, and reactive to light Right Ear: External ear  normal without discharge Left Ear: External ear normal without discharge Nose: Nose without discharge or deformity Mouth/Throat: Oropharynx is without other ulcerations and moist  Neck: Normal range of motion. Neck supple. No JVD present. No tracheal deviation present or significant neck LA or mass Cardiovascular: Normal rate, regular rhythm, normal heart sounds and intact distal pulses.   Pulmonary/Chest: WOB normal and breath sounds without rales or wheezing  Abdominal: Soft. Bowel sounds are normal. NT. No HSM  Musculoskeletal: Normal range of motion. Exhibits no edema Lymphadenopathy: Has no other cervical adenopathy.  Neurological: Pt is alert and oriented to person, place, and time. Pt has normal reflexes. No cranial nerve deficit. Motor grossly intact, Gait intact Skin: Skin is warm and dry. + scaly silvery rash to 4 x 2 cm area bilat post upper legs below the buttocks, no other noted or new ulcerations Psychiatric:  Has normal mood and affect. Behavior is normal without agitation Lab Results  Component Value Date   WBC 6.3 01/10/2018   HGB 16.8 01/10/2018   HCT 50.2 01/10/2018   PLT 197.0 01/10/2018   GLUCOSE 82 01/10/2018   CHOL 180 01/10/2018   TRIG 97.0 01/10/2018   HDL 41.90 01/10/2018   LDLCALC 118 (H) 01/10/2018   ALT 43 01/10/2018   AST 28 01/10/2018   NA 139 01/10/2018   K 4.1 01/10/2018   CL 103 01/10/2018   CREATININE 1.16 01/10/2018   BUN 18 01/10/2018   CO2 30 01/10/2018   TSH 1.51 01/10/2018   PSA 0.81 01/10/2018       Assessment & Plan:

## 2018-01-11 ENCOUNTER — Encounter: Payer: Self-pay | Admitting: Internal Medicine

## 2018-01-11 NOTE — Assessment & Plan Note (Signed)
To post legs - for lotrisone prn,  to f/u any worsening symptoms or concerns

## 2018-01-11 NOTE — Assessment & Plan Note (Signed)

## 2018-02-18 ENCOUNTER — Other Ambulatory Visit: Payer: Self-pay | Admitting: Internal Medicine

## 2019-01-14 ENCOUNTER — Emergency Department (HOSPITAL_BASED_OUTPATIENT_CLINIC_OR_DEPARTMENT_OTHER)
Admission: EM | Admit: 2019-01-14 | Discharge: 2019-01-14 | Disposition: A | Discharge status: Home / Self Care | Payer: BLUE CROSS/BLUE SHIELD | Attending: Emergency Medicine | Admitting: Emergency Medicine

## 2019-01-14 ENCOUNTER — Other Ambulatory Visit: Payer: Self-pay

## 2019-01-14 ENCOUNTER — Emergency Department (HOSPITAL_BASED_OUTPATIENT_CLINIC_OR_DEPARTMENT_OTHER): Payer: BLUE CROSS/BLUE SHIELD

## 2019-01-14 ENCOUNTER — Encounter: Payer: Self-pay | Admitting: Internal Medicine

## 2019-01-14 ENCOUNTER — Ambulatory Visit: Payer: Self-pay

## 2019-01-14 ENCOUNTER — Encounter (HOSPITAL_BASED_OUTPATIENT_CLINIC_OR_DEPARTMENT_OTHER): Payer: Self-pay

## 2019-01-14 ENCOUNTER — Ambulatory Visit (INDEPENDENT_AMBULATORY_CARE_PROVIDER_SITE_OTHER): Payer: BLUE CROSS/BLUE SHIELD | Admitting: Internal Medicine

## 2019-01-14 DIAGNOSIS — R000 Tachycardia, unspecified: Secondary | ICD-10-CM | POA: Diagnosis not present

## 2019-01-14 DIAGNOSIS — R0789 Other chest pain: Secondary | ICD-10-CM | POA: Diagnosis not present

## 2019-01-14 DIAGNOSIS — R079 Chest pain, unspecified: Secondary | ICD-10-CM | POA: Diagnosis not present

## 2019-01-14 DIAGNOSIS — E785 Hyperlipidemia, unspecified: Secondary | ICD-10-CM | POA: Diagnosis not present

## 2019-01-14 DIAGNOSIS — Z79899 Other long term (current) drug therapy: Secondary | ICD-10-CM | POA: Diagnosis not present

## 2019-01-14 DIAGNOSIS — J45909 Unspecified asthma, uncomplicated: Secondary | ICD-10-CM | POA: Diagnosis not present

## 2019-01-14 DIAGNOSIS — K219 Gastro-esophageal reflux disease without esophagitis: Secondary | ICD-10-CM | POA: Diagnosis not present

## 2019-01-14 DIAGNOSIS — J4520 Mild intermittent asthma, uncomplicated: Secondary | ICD-10-CM | POA: Diagnosis not present

## 2019-01-14 DIAGNOSIS — R002 Palpitations: Secondary | ICD-10-CM | POA: Diagnosis not present

## 2019-01-14 HISTORY — DX: Gastro-esophageal reflux disease without esophagitis: K21.9

## 2019-01-14 HISTORY — DX: Hyperlipidemia, unspecified: E78.5

## 2019-01-14 LAB — CBC WITH DIFFERENTIAL/PLATELET
Abs Immature Granulocytes: 0.01 10*3/uL (ref 0.00–0.07)
Basophils Absolute: 0 10*3/uL (ref 0.0–0.1)
Basophils Relative: 1 %
Eosinophils Absolute: 0.2 10*3/uL (ref 0.0–0.5)
Eosinophils Relative: 3 %
HCT: 49.9 % (ref 39.0–52.0)
Hemoglobin: 16.4 g/dL (ref 13.0–17.0)
Immature Granulocytes: 0 %
Lymphocytes Relative: 34 %
Lymphs Abs: 2.4 10*3/uL (ref 0.7–4.0)
MCH: 27.8 pg (ref 26.0–34.0)
MCHC: 32.9 g/dL (ref 30.0–36.0)
MCV: 84.7 fL (ref 80.0–100.0)
Monocytes Absolute: 0.6 10*3/uL (ref 0.1–1.0)
Monocytes Relative: 9 %
Neutro Abs: 3.7 10*3/uL (ref 1.7–7.7)
Neutrophils Relative %: 53 %
Platelets: 197 10*3/uL (ref 150–400)
RBC: 5.89 MIL/uL — ABNORMAL HIGH (ref 4.22–5.81)
RDW: 12.2 % (ref 11.5–15.5)
WBC: 6.9 10*3/uL (ref 4.0–10.5)
nRBC: 0 % (ref 0.0–0.2)

## 2019-01-14 LAB — BASIC METABOLIC PANEL
Anion gap: 6 (ref 5–15)
BUN: 15 mg/dL (ref 6–20)
CO2: 25 mmol/L (ref 22–32)
Calcium: 9 mg/dL (ref 8.9–10.3)
Chloride: 105 mmol/L (ref 98–111)
Creatinine, Ser: 1.09 mg/dL (ref 0.61–1.24)
GFR calc Af Amer: >60 mL/min (ref >60)
GFR calc non Af Amer: >60 mL/min (ref >60)
Glucose, Bld: 124 mg/dL — ABNORMAL HIGH (ref 70–99)
Potassium: 3.8 mmol/L (ref 3.5–5.1)
Sodium: 136 mmol/L (ref 135–145)

## 2019-01-14 LAB — TROPONIN I: Troponin I: <0.03 ng/mL (ref <0.03)

## 2019-01-14 NOTE — Assessment & Plan Note (Signed)
stable overall by history and exam, recent data reviewed with pt, and pt to continue medical treatment as before,  to f/u any worsening symptoms or concerns  

## 2019-01-14 NOTE — ED Provider Notes (Signed)
[facility] Provider Note   CSN: [id number] Arrival date & time: 01/14/19  1550    History   Chief Complaint Chief Complaint  Patient presents with  . Palpitations    HPI Andrew Bailey is a 43 y.o. male.     HPI   Patient is a 43 year old male with past medical history of hyperlipidemia, asthma, allergic rhinitis presenting for tachycardia on Fitbit watch and chest discomfort.  Patient reports that he noticed today that the data on his phone was showing that his heart rate is going up as high as 119.  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  Patient reports that since he has observed this he has not had any significant palpitations or shortness of breath correlating with the elevation in heart rate.  Patient is also reported that he is noticing intermittent chest discomfort in the left side of his chest over the past week.  He thought it was GERD and treated with Tums.  He is also restarted his allergy medication.  Patient reports that it is sometimes not noticeable.  He denies radiation of the pain.  Denies exertional chest pain or exertional shortness of breath.  No personal history of cardiovascular disease.  No family history of early cardiovascular disease, however his father does have a pacemaker.  Past Medical History:  Diagnosis Date  . Allergic conjunctivitis of both eyes 01/29/2012  . Allergic rhinitis, cause unspecified   . Allergy   . Asthma   . GERD (gastroesophageal reflux disease) 01/14/2019  . HLD (hyperlipidemia) 01/14/2019    Patient Active Problem List   Diagnosis Date Noted  . GERD (gastroesophageal reflux disease) 01/14/2019  . HLD (hyperlipidemia) 01/14/2019  . Shoulder pain 12/22/2016  . Rash 12/21/2015  . Chest pain 11/29/2015  . Upper back pain on left side 11/29/2015  . Lower back pain 11/29/2015  . Balanitis  01/29/2012  . Asthma   . Allergic rhinitis   . Preventative health care 01/25/2012    History reviewed. No pertinent surgical history.      Home Medications    Prior to Admission medications   Medication Sig Start Date End Date Taking? Authorizing Provider  azelastine (OPTIVAR) 0.05 % ophthalmic solution Place 1 drop into both eyes 2 (two) times daily. 12/21/16   Biagio Borg, MD  clotrimazole-betamethasone (LOTRISONE) cream USE AS DIRECTED TWICE DAILY AS NEEDED 02/19/18   Biagio Borg, MD  fexofenadine (ALLEGRA) 180 MG tablet Take 1 tablet (180 mg total) by mouth daily. 01/29/12 01/28/13  Biagio Borg, MD  ibuprofen (ADVIL,MOTRIN) 400 MG tablet Take 400 mg by mouth every 6 (six) hours as needed for pain.    [provider]  triamcinolone (NASACORT AQ) 55 MCG/ACT AERO nasal inhaler Place 2 sprays into the nose daily. 12/21/16   Biagio Borg, MD    Family History Family History  Problem Relation Age of Onset  . Diabetes Mother   . Heart disease Father   . Diabetes Father   . Hypertension Sister   . Heart disease Other   . Diabetes Other   . Cancer Other        colon and breast cancer  . Diabetes Other     Social History Social History   Tobacco Use  . Smoking status: Never Smoker  . Smokeless tobacco: Never Used  Substance Use  Topics  . Alcohol use: No  . Drug use: No     Allergies   Fish allergy and Other   Review of Systems Review of Systems  Constitutional: Negative for chills and fever.  HENT: Negative for congestion, rhinorrhea, sinus pain and sore throat.   Eyes: Negative for visual disturbance.  Respiratory: Negative for cough, chest tightness and shortness of breath.   Cardiovascular: Positive for palpitations. Negative for chest pain and leg swelling.  Gastrointestinal: Negative for abdominal pain, nausea and vomiting.  Genitourinary: Negative for dysuria.  Musculoskeletal: Negative for back pain and myalgias.  Skin: Negative for rash.   Neurological: Negative for dizziness, syncope and light-headedness.     Physical Exam Updated Vital Signs BP (!) 147/100 (BP Location: Right Arm)   Temp 98.1 F (36.7 C) (Oral)   Resp 18   Ht 5\' 5"  (1.651 m)   Wt 81.6 kg   SpO2 100%   BMI 29.95 kg/m   Physical Exam Vitals signs and nursing note reviewed.  Constitutional:      General: He is not in acute distress.    Appearance: He is well-developed.  HENT:     Head: Normocephalic and atraumatic.  Eyes:     Conjunctiva/sclera: Conjunctivae normal.     Pupils: Pupils are equal, round, and reactive to light.  Neck:     Musculoskeletal: Normal range of motion and neck supple.  Cardiovascular:     Rate and Rhythm: Normal rate and regular rhythm.     Pulses:          Radial pulses are 2+ on the right side and 2+ on the left side.       Dorsalis pedis pulses are 2+ on the right side and 2+ on the left side.     Heart sounds: S1 normal and S2 normal. No murmur.     Comments: No calf TTP.  Pulmonary:     Effort: Pulmonary effort is normal.     Breath sounds: Normal breath sounds. No wheezing or rales.  Abdominal:     General: There is no distension.     Palpations: Abdomen is soft.     Tenderness: There is no abdominal tenderness. There is no guarding.  Musculoskeletal: Normal range of motion.        General: No deformity.  Lymphadenopathy:     Cervical: No cervical adenopathy.  Skin:    General: Skin is warm and dry.     Findings: No erythema or rash.  Neurological:     Mental Status: He is alert.     Comments: Cranial nerves grossly intact. Patient moves extremities symmetrically and with good coordination.  Psychiatric:        Behavior: Behavior normal.        Thought Content: Thought content normal.        Judgment: Judgment normal.      ED Treatments / Results  Labs (all labs ordered are listed, but only abnormal results are displayed) Labs Reviewed  CBC WITH DIFFERENTIAL/PLATELET - Abnormal; Notable for  the following components:      Result Value   RBC 5.89 (*)    All other components within normal limits  BASIC METABOLIC PANEL - Abnormal; Notable for the following components:   Glucose, Bld 124 (*)    All other components within normal limits  TROPONIN I    EKG EKG Interpretation  Date/Time:  Wednesday January 14 2019 16:03:16 EDT Ventricular Rate:  72 PR Interval:  QRS Duration: 84 QT Interval:  361 QTC Calculation: 395 R Axis:   22 Text Interpretation:  Sinus rhythm Baseline wander in lead(s) V1 No previous tracing Confirmed by Blanchie Dessert [phone]) on 01/14/2019 4:55:27 PM   Radiology Dg Chest Portable 1 View  Result Date: 01/14/2019 CLINICAL DATA:  Tachycardia EXAM: PORTABLE CHEST 1 VIEW COMPARISON:  November 29, 2015 FINDINGS: Lungs are clear. Heart size and pulmonary vascularity are normal. No adenopathy. No bone lesions. IMPRESSION: No edema or consolidation. Electronically Signed   By: Lowella Grip III M.D.   On: 01/14/2019 17:04    Procedures Procedures (including critical care time)  Medications Ordered in ED Medications - No data to display   Initial Impression / Assessment and Plan / ED Course  I have reviewed the triage vital signs and the nursing notes.  Pertinent labs & imaging results that were available during my care of the patient were reviewed by me and considered in my medical decision making (see chart for details).       Patient nontoxic-appearing, hemodynamically stable, and with no tachycardia here in the emergency department peer differential diagnosis includes deconditioning, tachycardia secondary exertion, CAD.  Low suspicion for pulmonary embolism at this time.  Patient has Well's score of 0 and PERC of 0.   Work-up is very reassuring.  Troponin is negative.  EKG normal sinus rhythm with no evidence of ischemia, infarction, or arrhythmia.  Chest x-ray normal.  Heart score of 1 (for HLD).  At this time, I feel that patient's data on  his watch may be inaccurate.  Chest pain is highly atypical for angina.  Will have patient follow-up with PCP to decide if he needs further referral or management with Holter monitor, however do not feel that this is indicated at this time.  Return precautions given for any worsening pain, feeling of palpitations in the chest, shortness of breath with exertion.  Patient is in understanding and agrees with plan of care.  This is a supervised visit with Dr. Blanchie Dessert. Evaluation, management, and discharge planning discussed with this attending physician.  Final Clinical Impressions(s) / ED Diagnoses   Final diagnoses:  Tachycardia  Chest discomfort    ED Discharge Orders    None       Tamala Julian 01/14/19 1823    Blanchie Dessert, MD 01/14/19 1949

## 2019-01-14 NOTE — Discharge Instructions (Signed)
Please read and follow all provided instructions.  Your diagnoses today include:  1. Tachycardia   2. Chest discomfort    Your work-up is very reassuring today.  It is possible that your Fitbit information was inaccurate due to not picking up the heart rate normally.  I encourage you to check your heart rate manually multiple times a day by placing your fingers on the pulse.  Recommend close follow-up with Dr. Jenny Reichmann for further management.  Tests performed today include: An EKG of your heart A chest x-ray Cardiac enzymes - a blood test for heart muscle damage Blood counts and electrolytes Vital signs. See below for your results today.   Medications prescribed:   Take any prescribed medications only as directed.  Follow-up instructions: Please follow-up with your primary care provider as soon as you can for further evaluation of your symptoms.   Return instructions:  SEEK IMMEDIATE MEDICAL ATTENTION IF: You have severe chest pain, especially if the pain is crushing or pressure-like and spreads to the arms, back, neck, or jaw, or if you have sweating, nausea (feeling sick to your stomach), or shortness of breath. THIS IS AN EMERGENCY. Don't wait to see if the pain will go away. Get medical help at once. Call 911 or [phone] (operator). DO NOT drive yourself to the hospital.  Your chest pain gets worse and does not go away with rest.  You have an attack of chest pain lasting longer than usual, despite rest and treatment with the medications your caregiver has prescribed.  You wake from sleep with chest pain or shortness of breath. You feel dizzy or faint. You have chest pain not typical of your usual pain for which you originally saw your caregiver.  You have any other emergent concerns regarding your health.  Additional Information: Chest pain comes from many different causes. Your caregiver has diagnosed you as having chest pain that is not specific for one problem, but does not require  admission.  You are at low risk for an acute heart condition or other serious illness.   Your vital signs today were: BP 129/90    Pulse 66    Temp 98.1 F (36.7 C) (Oral)    Resp 15    Ht 5\' 5"  (1.651 m)    Wt 81.6 kg    SpO2 98%    BMI 29.95 kg/m  If your blood pressure (BP) was elevated above 135/85 this visit, please have this repeated by your doctor within one month. --------------

## 2019-01-14 NOTE — ED Triage Notes (Signed)
Pt states saw HR elevated by watching fit bit watch, denies chest pain, states just had a funny feeling in center of chest.  Denies shortness of breath.  This happened while at work.  Called pmd, they did virtual visit, pt was sent here for evaluation.

## 2019-01-14 NOTE — Assessment & Plan Note (Signed)
For low chol diet, f/u lipids with next labs

## 2019-01-14 NOTE — Telephone Encounter (Signed)
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 Pt states he was working at the time and did not notice anything unusual. No palpation. No nausea.  He dose give Hx of several days of a different feeling in there center left chest area. He states that he wasn't shore what it might be. He denies pain of any kind. No shoulder back arm pain. No sweats or SOB. Care advice read to patient. Pt verbalized understanding of all instructions. Web visit explained to patient. E mail corrected in chart. Per office protocol call was transferred to Sam at the office for [facility] appointment. Reason for Disposition . [1] Chest pain lasts > 5 minutes AND [2] occurred > 3 days ago (72 hours) AND [3] NO chest pain or cardiac symptoms now  Answer Assessment - Initial Assessment Questions 1. DESCRIPTION: "Please describe your heart rate or heart beat that you are having" (e.g., fast/slow, regular/irregular, skipped or extra beats, "palpitations")     Fast a few times 2. ONSET: "When did it start?" (Minutes, hours or days)      Today 111 3. DURATION: "How long does it last" (e.g., seconds, minutes, hours)     A few minutes 4. PATTERN "Does it come and go, or has it been constant since it started?"  "Does it get worse with exertion?"   "Are you feeling it now?"    Yes fast with exertion 5. TAP: "Using your hand, can you tap out what you are feeling on a chair or table in front of you, so that I can hear?" (Note: not all patients can do this)      No 6. HEART RATE: "Can you tell me your heart rate?" "How many beats in 15 seconds?"  (Note: not all patients can do this)       92 7. RECURRENT SYMPTOM: "Have you ever had this before?" If so, ask: "When was the last time?" and "What happened that time?"      no 8. CAUSE: "What do you think is causing the palpitations?"     unsure 9. CARDIAC HISTORY: "Do you have any history of heart disease?" (e.g., heart  attack, angina, bypass surgery, angioplasty, arrhythmia)     no 10. OTHER SYMPTOMS: "Do you have any other symptoms?" (e.g., dizziness, chest pain, sweating, difficulty breathing)       Acid reflux pain not radiating but not now 11. PREGNANCY: "Is there any chance you are pregnant?" "When was your last menstrual period?"       N/A  Answer Assessment - Initial Assessment Questions 1. LOCATION: "Where does it hurt?"       Center of chest left side 2. RADIATION: "Does the pain go anywhere else?" (e.g., into neck, jaw, arms, back)    No 3. ONSET: "When did the chest pain begin?" (Minutes, hours or days)      Several days 4. PATTERN "Does the pain come and go, or has it been constant since it started?"  "Does it get worse with exertion?"      constant 5. DURATION: "How long does it last" (e.g., seconds, minutes, hours)     hours 6. SEVERITY: "How bad is the pain?"  (e.g., Scale 1-10; mild, moderate, or severe)    - MILD (1-3): doesn't interfere with normal activities     - MODERATE (4-7): interferes with normal activities or awakens from sleep    -  SEVERE (8-10): excruciating pain, unable to do any normal activities      1 7. CARDIAC RISK FACTORS: "Do you have any history of heart problems or risk factors for heart disease?" (e.g., prior heart attack, angina; high blood pressure, diabetes, being overweight, high cholesterol, smoking, or strong family history of heart disease)    No cholesterol is high 8. PULMONARY RISK FACTORS: "Do you have any history of lung disease?"  (e.g., blood clots in lung, asthma, emphysema, birth control pills)     no 9. CAUSE: "What do you think is causing the chest pain?"    Acid reflux 10. OTHER SYMPTOMS: "Do you have any other symptoms?" (e.g., dizziness, nausea, vomiting, sweating, fever, difficulty breathing, cough)      no 11. PREGNANCY: "Is there any chance you are pregnant?" "When was your last menstrual period?"       No  Protocols used: CHEST  PAIN-A-AH, HEART RATE AND HEARTBEAT QUESTIONS-A-AH

## 2019-01-14 NOTE — Progress Notes (Signed)
Patient ID: Andrew Bailey, male   DOB: September 20, 1976, 43 y.o.   MRN: [id number]  Virtual Visit via Video Note  I connected with Andrew Bailey on 01/14/19 at  3:00 PM EDT by a video enabled telemedicine application and verified that I am speaking with the correct person using two identifiers. Pt is at home, I am in office, and no other persons present  Pt is at home, I am in office, and no other persons present   I discussed the limitations of evaluation and management by telemedicine and the availability of in person appointments. The patient expressed understanding and agreed to proceed.  History of Present Illness: Pt presents with c/o CP - 1 wk dull intermittent SSCP with fatigue and unusual elevated HR up to 119 he notes on his watch when walking in the parking lot; he denies radiation, vomiting, palps, dizziness, diaphoresis or sob.  Had some nausea last wk one day, and does have occasional sour brash at night and does not take PPI, but this pain not better with TUMS.  Did have some wt lifting for exercise about 2 wks ago, but this pain started 1 wk ago.  Pain is not pleuritic, positional or excertional that he can tell.  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  No prior hx of CAD or stress testing.  Pt denies wheezing, orthopnea, PND, increased LE swelling, or syncope.  Has hx of mild HLD, but has been Non smoker, and no hx of DM, HTN.  Denies fever, cough and does not feel acutely ill   Past Medical History:  Diagnosis Date  . Allergic conjunctivitis of both eyes 01/29/2012  . Allergic rhinitis, cause unspecified   . Allergy   . Asthma    No past surgical history on file.  reports that he has never smoked. He has never used smokeless tobacco. He reports that he does not drink alcohol or use drugs. family history includes Cancer in an other family  member; Diabetes in his father, mother, and other family members; Heart disease in his father and another family member; Hypertension in his sister. Allergies  Allergen Reactions  . Fish Allergy Itching    Top of mouth  . Other Other (See Comments)    Grass causes seasonal allergies   Current Outpatient Medications on File Prior to Visit  Medication Sig Dispense Refill  . azelastine (OPTIVAR) 0.05 % ophthalmic solution Place 1 drop into both eyes 2 (two) times daily. 6 mL 12  . clotrimazole-betamethasone (LOTRISONE) cream USE AS DIRECTED TWICE DAILY AS NEEDED 15 g 1  . fexofenadine (ALLEGRA) 180 MG tablet Take 1 tablet (180 mg total) by mouth daily. 30 tablet 11  . ibuprofen (ADVIL,MOTRIN) 400 MG tablet Take 400 mg by mouth every 6 (six) hours as needed for pain.    Marland Kitchen triamcinolone (NASACORT AQ) 55 MCG/ACT AERO nasal inhaler Place 2 sprays into the nose daily. 1 Inhaler 12   No current facility-administered medications on file prior to visit.   Observations/Objective: Alert, mentating well, mild nervous but not ill appearing, cn 2-12 intact, moves all 4s, no visible rash or swelling, resps appear normal  Lab Results  Component Value Date   WBC 6.3 01/10/2018   HGB 16.8 01/10/2018   HCT 50.2 01/10/2018   PLT 197.0 01/10/2018   GLUCOSE 82  01/10/2018   CHOL 180 01/10/2018   TRIG 97.0 01/10/2018   HDL 41.90 01/10/2018   LDLCALC 118 (H) 01/10/2018   ALT 43 01/10/2018   AST 28 01/10/2018   NA 139 01/10/2018   K 4.1 01/10/2018   CL 103 01/10/2018   CREATININE 1.16 01/10/2018   BUN 18 01/10/2018   CO2 30 01/10/2018   TSH 1.51 01/10/2018   PSA 0.81 01/10/2018   Assessment and Plan: See notes  Follow Up Instructions: See notes   I discussed the assessment and treatment plan with the patient. The patient was provided an opportunity to ask questions and all were answered. The patient agreed with the plan and demonstrated an understanding of the instructions.   The patient was  advised to call back or seek an in-person evaluation if the symptoms worsen or if the condition fails to improve as anticipated.   Cathlean Cower, MD

## 2019-01-14 NOTE — Telephone Encounter (Signed)
Virtual Visit scheduled by Sam.

## 2019-01-14 NOTE — Patient Instructions (Signed)
Please continue all other medications as before, and refills have been done if requested.  Please have the pharmacy call with any other refills you may need.  Please continue your efforts at being more active, low cholesterol diet, and weight control.  Please keep your appointments with your specialists as you may have planned  Please go to ED now for evaluation for CP

## 2019-01-14 NOTE — Assessment & Plan Note (Signed)
Atypical pain, etiology not clearly msk or GI by hx, has hx of mild HLD only and quite young but cant r/o unusual presentation for CAD/angina; pt is advised to go to ED with most likely lab eval, ecg, cxr and other as appropriate.  He agrees and will go now.

## 2019-01-14 NOTE — Assessment & Plan Note (Signed)
Mild intermittent, cont tums prn

## 2019-01-16 ENCOUNTER — Encounter: Payer: BLUE CROSS/BLUE SHIELD | Admitting: Internal Medicine

## 2019-01-23 ENCOUNTER — Ambulatory Visit (INDEPENDENT_AMBULATORY_CARE_PROVIDER_SITE_OTHER): Payer: BLUE CROSS/BLUE SHIELD | Admitting: Internal Medicine

## 2019-01-23 DIAGNOSIS — J309 Allergic rhinitis, unspecified: Secondary | ICD-10-CM

## 2019-01-23 DIAGNOSIS — Z0000 Encounter for general adult medical examination without abnormal findings: Secondary | ICD-10-CM

## 2019-01-23 DIAGNOSIS — R739 Hyperglycemia, unspecified: Secondary | ICD-10-CM

## 2019-01-23 DIAGNOSIS — R079 Chest pain, unspecified: Secondary | ICD-10-CM | POA: Diagnosis not present

## 2019-01-23 DIAGNOSIS — R000 Tachycardia, unspecified: Secondary | ICD-10-CM

## 2019-01-23 DIAGNOSIS — Z0001 Encounter for general adult medical examination with abnormal findings: Secondary | ICD-10-CM

## 2019-01-23 NOTE — Patient Instructions (Signed)

## 2019-01-23 NOTE — Progress Notes (Signed)
Patient ID: Andrew Bailey, male   DOB: 1976-02-13, 43 y.o.   MRN: [id number]  Virtual Visit via Video Note  I connected with Andrew Bailey on 01/23/19 at  2:20 PM EDT by a video enabled telemedicine application and verified that I am speaking with the correct person using two identifiers.  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 The patient expressed understanding and agreed to proceed.  History of Present Illness: Here for wellness and f/u;  Overall doing ok;  Pt denies worsening SOB, DOE, wheezing, orthopnea, PND, worsening LE edema, palpitations, dizziness or syncope.  Pt denies neurological change such as new headache, facial or extremity weakness.  Pt states overall good compliance with treatment and medications, good tolerability, and has been trying to follow appropriate diet.  Pt denies worsening depressive symptoms, suicidal ideation or panic. No fever, night sweats, wt loss, loss of appetite, or other constitutional symptoms.  Pt states good ability with ADL's, has low fall risk, home safety reviewed and adequate, no other significant changes in hearing or vision, and only occasionally active with exercise.  Unfortunately now the heaviest he has been. Does c/o ongoing fatigue, but denies signficant daytime hypersomnolence.    Also with c/o > 6 mo recurrrent low sternal sharp pain worse with bending forward, and taking deep breaths.  Pt was just seen 4/8 at ED for CP with negative lab, troponin, cxr, ecg for acute.  Pt reports no further elevated HRs noted on his watch.   Pt denies polydipsia, polyuria, or low sugar symptoms such as weakness or confusion improved with po intake.  Pt states overall good compliance with meds, trying to follow lower chol lower carb diet.     Also, Does have several wks  ongoing nasal allergy symptoms with clearish congestion, itch and sneezing, without fever, pain, ST, cough, swelling or wheezing. Past Medical History:  Diagnosis Date  . Allergic conjunctivitis of both eyes 01/29/2012  . Allergic rhinitis, cause unspecified   . Allergy   . Asthma   . GERD (gastroesophageal reflux disease) 01/14/2019  . HLD (hyperlipidemia) 01/14/2019   No past surgical history on file.  reports that he has never smoked. He has never used smokeless tobacco. He reports that he does not drink alcohol or use drugs. family history includes Cancer in an other family member; Diabetes in his father, mother, and other family members; Heart disease in his father and another family member; Hypertension in his sister. Allergies  Allergen Reactions  . Fish Allergy Itching    Top of mouth  . Other Other (See Comments)    Grass causes seasonal allergies   Current Outpatient Medications on File Prior to Visit  Medication Sig Dispense Refill  . azelastine (OPTIVAR) 0.05 % ophthalmic solution Place 1 drop into both eyes 2 (two) times daily. 6 mL 12  . clotrimazole-betamethasone (LOTRISONE) cream USE AS DIRECTED TWICE DAILY AS NEEDED 15 g 1  . fexofenadine (ALLEGRA) 180 MG tablet Take 1 tablet (180 mg total) by mouth daily. 30 tablet 11  . ibuprofen (ADVIL,MOTRIN) 400 MG tablet Take 400 mg by mouth every 6 (six) hours as needed for pain.    Marland Kitchen triamcinolone (NASACORT AQ) 55 MCG/ACT AERO nasal inhaler Place 2 sprays into the nose daily. 1 Inhaler 12   No current facility-administered medications  on file prior to visit.    Observations/Objective: Alert and appropriate, mentation at baseline, in NAD, not ill appearing, obese, resps normal, cn 2-12 intact, motor - moves all 4s, no visible rash, swelling or skin lesions noted to upper body visible Lab Results  Component Value Date   WBC 6.9 01/14/2019   HGB 16.4 01/14/2019   HCT 49.9 01/14/2019   PLT 197 01/14/2019   GLUCOSE 124 (H)  01/14/2019   CHOL 180 01/10/2018   TRIG 97.0 01/10/2018   HDL 41.90 01/10/2018   LDLCALC 118 (H) 01/10/2018   ALT 43 01/10/2018   AST 28 01/10/2018   NA 136 01/14/2019   K 3.8 01/14/2019   CL 105 01/14/2019   CREATININE 1.09 01/14/2019   BUN 15 01/14/2019   CO2 25 01/14/2019   TSH 1.51 01/10/2018   PSA 0.81 01/10/2018   Assessment and Plan: See notes  Follow Up Instructions: See notes   I discussed the assessment and treatment plan with the patient. The patient was provided an opportunity to ask questions and all were answered. The patient agreed with the plan and demonstrated an understanding of the instructions.   The patient was advised to call back or seek an in-person evaluation if the symptoms worsen or if the condition fails to improve as anticipated.   Cathlean Cower, MD

## 2019-01-24 ENCOUNTER — Encounter: Payer: Self-pay | Admitting: Internal Medicine

## 2019-01-24 DIAGNOSIS — R000 Tachycardia, unspecified: Secondary | ICD-10-CM | POA: Insufficient documentation

## 2019-01-24 NOTE — Assessment & Plan Note (Addendum)
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

## 2019-01-24 NOTE — Assessment & Plan Note (Signed)
Mild to mod, for antihistamin, and flonase asd,  to f/u any worsening symptoms or concerns

## 2019-01-24 NOTE — Assessment & Plan Note (Signed)

## 2019-01-24 NOTE — Assessment & Plan Note (Signed)
Very mild, ? Related to deconditioning and sedentary lifestyle, seemingly improved, for TSH with labs and consider holter monitor

## 2019-01-24 NOTE — Assessment & Plan Note (Signed)
stable overall by history and exam, recent data reviewed with pt, and pt to continue medical treatment as before,  to f/u any worsening symptoms or concerns, for a1c with labs 

## 2019-02-02 ENCOUNTER — Other Ambulatory Visit (INDEPENDENT_AMBULATORY_CARE_PROVIDER_SITE_OTHER): Payer: BLUE CROSS/BLUE SHIELD

## 2019-02-02 DIAGNOSIS — Z0000 Encounter for general adult medical examination without abnormal findings: Secondary | ICD-10-CM | POA: Diagnosis not present

## 2019-02-02 DIAGNOSIS — R739 Hyperglycemia, unspecified: Secondary | ICD-10-CM

## 2019-02-02 LAB — LIPID PANEL
Cholesterol: 183 mg/dL (ref 0–200)
HDL: 39.6 mg/dL (ref >39.00)
LDL Cholesterol: 132 mg/dL — ABNORMAL HIGH (ref 0–99)
NonHDL: 143.73
Total CHOL/HDL Ratio: 5
Triglycerides: 58 mg/dL (ref 0.0–149.0)
VLDL: 11.6 mg/dL (ref 0.0–40.0)

## 2019-02-02 LAB — HEPATIC FUNCTION PANEL
ALT: 29 U/L (ref 0–53)
AST: 21 U/L (ref 0–37)
Albumin: 4.3 g/dL (ref 3.5–5.2)
Alkaline Phosphatase: 61 U/L (ref 39–117)
Bilirubin, Direct: 0.1 mg/dL (ref 0.0–0.3)
Total Bilirubin: 0.6 mg/dL (ref 0.2–1.2)
Total Protein: 7.1 g/dL (ref 6.0–8.3)

## 2019-02-02 LAB — URINALYSIS, ROUTINE W REFLEX MICROSCOPIC
Bilirubin Urine: NEGATIVE
Leukocytes,Ua: NEGATIVE
Nitrite: NEGATIVE
Specific Gravity, Urine: 1.02 (ref 1.000–1.030)
Total Protein, Urine: NEGATIVE
Urine Glucose: NEGATIVE
Urobilinogen, UA: 2 — AB (ref 0.0–1.0)
WBC, UA: NONE SEEN (ref 0–?)
pH: 7 (ref 5.0–8.0)

## 2019-02-02 LAB — HEMOGLOBIN A1C: Hgb A1c MFr Bld: 6.1 % (ref 4.6–6.5)

## 2019-02-02 LAB — TSH: TSH: 1.1 u[IU]/mL (ref 0.35–4.50)

## 2019-02-02 LAB — PSA: PSA: 0.59 ng/mL (ref 0.10–4.00)

## 2019-02-19 ENCOUNTER — Telehealth: Payer: Self-pay | Admitting: Emergency Medicine

## 2019-02-19 ENCOUNTER — Ambulatory Visit: Payer: Self-pay

## 2019-02-19 NOTE — Telephone Encounter (Signed)
Pt called in stating he still feels that he has something stuck in his throat. He was seen by virtual visit on 01/23/19. He denies any symptoms of Covid-19. States it feels like reflux but has never had any issues of reflux. His son was tested for Covid this week due to having an on going cough and should get his results back tomorrow. Pt would like to know if he should get tested as well. Advised pt [facility] is doing testing and he can call them and make an appt. Please advise.

## 2019-02-19 NOTE — Telephone Encounter (Signed)
Pt c/o feeling like there is something n the back of his throat. He stated the sensation started a month ago. Pt denies sore throat, fever, SOB. Pt has occasional cough. 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 Pt left eye redness and a "grainy feeling to the eye. Pt advised by his symptoms he is low risk But  If he has been in contact with sone and son tests positive, he will require quarantine. Care advice given to pt and pt verbalized understanding.  Reason for Disposition . COVID-19 Testing, questions about  Answer Assessment - Initial Assessment Questions 1. ONSET: "When did the throat start hurting?" (Hours or days ago)      1 month ago 2. SEVERITY: "How bad is the sore throat?" (Scale 1-10; mild, moderate or severe)   - MILD (1-3):  doesn't interfere with eating or normal activities   - MODERATE (4-7): interferes with eating some solids and normal activities   - SEVERE (8-10):  excruciating pain, interferes with most normal activities   - SEVERE DYSPHAGIA: can't swallow liquids, drooling     no 3. STREP EXPOSURE: "Has there been any exposure to strep within the past week?" If so, ask: "What type of contact occurred?"     no 4.  VIRAL SYMPTOMS: "Are there any symptoms of a cold, such as a runny nose, cough, hoarse voice or red eyes?"      Occasional cough, occasion, left eye redder, and grainy feeling in left eye  5. FEVER: "Do you have a fever?" If so, ask: "What is your temperature, how was it measured, and when did it start?"     no 6. PUS ON THE TONSILS: "Is there pus on the tonsils in the back of your throat?"    no 7. OTHER SYMPTOMS: "Do you have any other symptoms?" (e.g., difficulty breathing, headache, rash)     no 8. PREGNANCY: "Is there any chance you are pregnant?" "When was your last menstrual period?"    no  Answer Assessment - Initial Assessment Questions 1. COVID-19 DIAGNOSIS: "Who made your Coronavirus (COVID-19)  diagnosis?" "Was it confirmed by a positive lab test?" If not diagnosed by a HCP, ask "Are there lots of cases (community spread) where you live?" (See public health department website, if unsure)   * MAJOR community spread: high number of cases; numbers of cases are increasing; many people hospitalized.   * MINOR community spread: low number of cases; not increasing; few or no people hospitalized     minor 2. ONSET: "When did the COVID-19 symptoms start?"      1 month ago 3. WORST SYMPTOM: "What is your worst symptom?" (e.g., cough, fever, shortness of breath, muscle aches)     Feels like some thing in the the back of throat comes and goes left eye redness, left eye feels like something is in it 4. COUGH: "Do you have a cough?" If so, ask: "How bad is the cough?"       occasional chest 5. FEVER: "Do you have a fever?" If so, ask: "What is your temperature, how was it measured, and when did it start?"     no 6. RESPIRATORY STATUS: "Describe your breathing?" (e.g., shortness of breath, wheezing, unable to speak)     no 7. BETTER-SAME-WORSE: "Are you getting better, staying the same or getting worse compared to yesterday?"  If getting worse, ask, "In what way?"    Slightly better but worried  about persistent throat symptom 8. HIGH RISK DISEASE: "Do you have any chronic medical problems?" (e.g., asthma, heart or lung disease, weak immune system, etc.)     no 9. PREGNANCY: "Is there any chance you are pregnant?" "When was your last menstrual period?"    no 10. OTHER SYMPTOMS: "Do you have any other symptoms?"  (e.g., runny nose, headache, sore throat, loss of smell)      no  Protocols used: CORONAVIRUS (COVID-19) DIAGNOSED OR SUSPECTED-A-AH, SORE THROAT-A-AH

## 2019-02-19 NOTE — Telephone Encounter (Signed)
Ok to try OTC prilosec 20 mg at 2 per day until improved  I would suggest testing if his son is positive, otherwise he would not need testing based on these symptoms, thanks

## 2019-02-20 NOTE — Telephone Encounter (Signed)
Spoke with pt to inform.  

## 2019-03-28 DIAGNOSIS — Z23 Encounter for immunization: Secondary | ICD-10-CM | POA: Diagnosis not present

## 2019-03-28 DIAGNOSIS — W228XXA Striking against or struck by other objects, initial encounter: Secondary | ICD-10-CM | POA: Diagnosis not present

## 2019-03-28 DIAGNOSIS — S0181XA Laceration without foreign body of other part of head, initial encounter: Secondary | ICD-10-CM | POA: Diagnosis not present

## 2019-04-02 DIAGNOSIS — Z4802 Encounter for removal of sutures: Secondary | ICD-10-CM | POA: Diagnosis not present

## 2019-04-25 DIAGNOSIS — Z03818 Encounter for observation for suspected exposure to other biological agents ruled out: Secondary | ICD-10-CM | POA: Diagnosis not present

## 2019-05-14 ENCOUNTER — Encounter: Payer: Self-pay | Admitting: Internal Medicine

## 2019-05-14 ENCOUNTER — Other Ambulatory Visit: Payer: Self-pay

## 2019-05-14 ENCOUNTER — Ambulatory Visit (INDEPENDENT_AMBULATORY_CARE_PROVIDER_SITE_OTHER): Payer: BLUE CROSS/BLUE SHIELD | Admitting: Internal Medicine

## 2019-05-14 VITALS — BP 122/78 | HR 70 | Temp 98.6°F | Ht 65.0 in | Wt 176.0 lb

## 2019-05-14 DIAGNOSIS — R079 Chest pain, unspecified: Secondary | ICD-10-CM

## 2019-05-14 DIAGNOSIS — L723 Sebaceous cyst: Secondary | ICD-10-CM

## 2019-05-14 DIAGNOSIS — R739 Hyperglycemia, unspecified: Secondary | ICD-10-CM

## 2019-05-14 DIAGNOSIS — E785 Hyperlipidemia, unspecified: Secondary | ICD-10-CM | POA: Diagnosis not present

## 2019-05-14 NOTE — Patient Instructions (Signed)
Please continue all other medications as before, and refills have been done if requested.  Please have the pharmacy call with any other refills you may need.  Please keep your appointments with your specialists as you may have planned  You will be contacted regarding the referral for: General Surgury, and stress test  You will be contacted by phone if any changes need to be made immediately.  Otherwise, you will receive a letter about your results with an explanation, but please check with MyChart first.  Please remember to sign up for MyChart if you have not done so, as this will be important to you in the future with finding out test results, communicating by private email, and scheduling acute appointments online when needed.

## 2019-05-14 NOTE — Progress Notes (Signed)
Subjective:    Patient ID: Andrew Bailey, male    DOB: 1976-07-01, 43 y.o.   MRN: [id number]  HPI  Here to f/u; overall doing ok,  Pt denies, increasing sob or doe, wheezing, orthopnea, PND, increased LE swelling, palpitations, dizziness or syncope, though has had mild intemittent dull left CP for 3 wks.   Pt denies new neurological symptoms such as new headache, or facial or extremity weakness or numbness.  Pt denies polydipsia, polyuria, or low sugar episode.  Pt states overall good compliance with meds, mostly trying to follow appropriate diet, with wt overall stable,  but little exercise however.  ALso has a small subq mass to the left post neck, wondering if this is something serious.  Also COVID PCR neg testing done early June 2020.   Had an COVID ab test neg about 3 wks ago as well.   Past Medical History:  Diagnosis Date  . Allergic conjunctivitis of both eyes 01/29/2012  . Allergic rhinitis, cause unspecified   . Allergy   . Asthma   . GERD (gastroesophageal reflux disease) 01/14/2019  . HLD (hyperlipidemia) 01/14/2019   No past surgical history on file.  reports that he has never smoked. He has never used smokeless tobacco. He reports that he does not drink alcohol or use drugs. family history includes Cancer in an other family member; Diabetes in his father, mother, and other family members; Heart disease in his father and another family member; Hypertension in his sister. Allergies  Allergen Reactions  . Fish Allergy Itching    Top of mouth  . Other Other (See Comments)    Grass causes seasonal allergies   Current Outpatient Medications on File Prior to Visit  Medication Sig Dispense Refill  . azelastine (OPTIVAR) 0.05 % ophthalmic solution Place 1 drop into both eyes 2 (two) times daily. 6 mL 12  . clotrimazole-betamethasone (LOTRISONE) cream USE AS DIRECTED TWICE DAILY AS NEEDED 15 g 1  . ibuprofen (ADVIL,MOTRIN) 400 MG tablet Take 400 mg by mouth every 6 (six) hours as  needed for pain.    Marland Kitchen triamcinolone (NASACORT AQ) 55 MCG/ACT AERO nasal inhaler Place 2 sprays into the nose daily. 1 Inhaler 12  . fexofenadine (ALLEGRA) 180 MG tablet Take 1 tablet (180 mg total) by mouth daily. 30 tablet 11   No current facility-administered medications on file prior to visit.    Review of Systems  Constitutional: Negative for other unusual diaphoresis or sweats HENT: Negative for ear discharge or swelling Eyes: Negative for other worsening visual disturbances Respiratory: Negative for stridor or other swelling  Gastrointestinal: Negative for worsening distension or other blood Genitourinary: Negative for retention or other urinary change Musculoskeletal: Negative for other MSK pain or swelling Skin: Negative for color change or other new lesions Neurological: Negative for worsening tremors and other numbness  Psychiatric/Behavioral: Negative for worsening agitation or other fatigue All other system neg per pt    Objective:   Physical Exam BP 122/78   Pulse 70   Temp 98.6 F (37 C) (Oral)   Ht 5\' 5"  (1.651 m)   Wt 176 lb (79.8 kg)   SpO2 97%   BMI 29.29 kg/m  VS noted,  Constitutional: Pt appears in NAD HENT: Head: NCAT.  Right Ear: External ear normal.  Left Ear: External ear normal.  Eyes: . Pupils are equal, round, and reactive to light. Conjunctivae and EOM are normal Nose: without d/c or deformity Neck: Neck supple. Gross normal ROM Cardiovascular: Normal  rate and regular rhythm.   Pulmonary/Chest: Effort normal and breath sounds without rales or wheezing.  Abd:  Soft, NT, ND, + BS, no organomegaly Neurological: Pt is alert. At baseline orientation, motor grossly intact Skin: Skin is warm. No rashes, no LE edema, has a < 0.5 cm subq cyst to left mid post neck noninflamed Psychiatric: Pt behavior is normal without agitation  No other exam findings Lab Results  Component Value Date   WBC 6.9 01/14/2019   HGB 16.4 01/14/2019   HCT 49.9 01/14/2019    PLT 197 01/14/2019   GLUCOSE 124 (H) 01/14/2019   CHOL 183 02/02/2019   TRIG 58.0 02/02/2019   HDL 39.60 02/02/2019   LDLCALC 132 (H) 02/02/2019   ALT 29 02/02/2019   AST 21 02/02/2019   NA 136 01/14/2019   K 3.8 01/14/2019   CL 105 01/14/2019   CREATININE 1.09 01/14/2019   BUN 15 01/14/2019   CO2 25 01/14/2019   TSH 1.10 02/02/2019   PSA 0.59 02/02/2019   HGBA1C 6.1 02/02/2019       Assessment & Plan:

## 2019-05-17 ENCOUNTER — Encounter: Payer: Self-pay | Admitting: Internal Medicine

## 2019-05-17 NOTE — Assessment & Plan Note (Signed)
Atypical, ecg reveiwed, for stress test,  to f/u any worsening symptoms or concerns

## 2019-05-17 NOTE — Assessment & Plan Note (Signed)
stable overall by history and exam, recent data reviewed with pt, and pt to continue medical treatment as before,  to f/u any worsening symptoms or concerns  

## 2019-05-17 NOTE — Assessment & Plan Note (Signed)
Lab Results  Component Value Date   LDLCALC 132 (H) 02/02/2019  declines statin, cont low chol diet

## 2019-05-17 NOTE — Assessment & Plan Note (Signed)
Ok for general surgury referall as enlarging and pt asks for removal

## 2019-05-19 ENCOUNTER — Telehealth (HOSPITAL_COMMUNITY): Payer: Self-pay

## 2019-05-19 NOTE — Telephone Encounter (Signed)
Spoke with the patient, instructions given. He stated that he would be here for his test. Andrew Bailey EMTP

## 2019-05-21 ENCOUNTER — Ambulatory Visit (HOSPITAL_COMMUNITY): Payer: BLUE CROSS/BLUE SHIELD | Attending: Internal Medicine

## 2019-05-21 ENCOUNTER — Other Ambulatory Visit: Payer: Self-pay

## 2019-05-21 DIAGNOSIS — R739 Hyperglycemia, unspecified: Secondary | ICD-10-CM | POA: Diagnosis not present

## 2019-05-21 DIAGNOSIS — E785 Hyperlipidemia, unspecified: Secondary | ICD-10-CM

## 2019-05-21 DIAGNOSIS — R079 Chest pain, unspecified: Secondary | ICD-10-CM | POA: Insufficient documentation

## 2019-05-21 LAB — MYOCARDIAL PERFUSION IMAGING
LV dias vol: 87 mL (ref 62–150)
LV sys vol: 44 mL
Peak HR: 97 {beats}/min
Rest HR: 59 {beats}/min
SDS: 2
SRS: 0
SSS: 2
TID: 0.98

## 2019-05-21 MED ORDER — TECHNETIUM TC 99M TETROFOSMIN IV KIT
30.7000 | PACK | Freq: Once | INTRAVENOUS | Status: AC | PRN
  Administered 2019-05-21: 30.7 via INTRAVENOUS
  Filled 2019-05-21: qty 31

## 2019-05-21 MED ORDER — TECHNETIUM TC 99M TETROFOSMIN IV KIT
9.8000 | PACK | Freq: Once | INTRAVENOUS | Status: AC | PRN
  Administered 2019-05-21: 9.8 via INTRAVENOUS
  Filled 2019-05-21: qty 10

## 2019-05-21 MED ORDER — REGADENOSON 0.4 MG/5ML IV SOLN
0.4000 mg | Freq: Once | INTRAVENOUS | Status: AC
  Administered 2019-05-21: 0.4 mg via INTRAVENOUS

## 2019-07-01 ENCOUNTER — Telehealth: Payer: Self-pay

## 2019-07-01 NOTE — Telephone Encounter (Signed)
OK to let pt know the test was normal, and overall good heart function, no further evaluation is needed

## 2019-07-01 NOTE — Telephone Encounter (Signed)
Copied from [place] [id number]. Topic: Quick Communication - Other Results (Clinic Use ONLY) >> Jul 01, 2019 11:44 AM Lennox Solders wrote: PT would like a callback to go over his stress test. Pt viewed results on mychart and does not fully understand the report

## 2019-07-01 NOTE — Telephone Encounter (Signed)
Called pt, LVM.   

## 2019-07-02 NOTE — Telephone Encounter (Signed)
Pt has been informed of results and expressed understanding.  °

## 2019-07-02 NOTE — Telephone Encounter (Signed)
Patient would like a call from the nurse to explain his test results.  He missed the call when the nurse had called him before.  CB# [phone]

## 2019-07-20 ENCOUNTER — Encounter: Payer: Self-pay | Admitting: Internal Medicine

## 2019-07-20 ENCOUNTER — Other Ambulatory Visit: Payer: Self-pay

## 2019-07-20 ENCOUNTER — Ambulatory Visit (INDEPENDENT_AMBULATORY_CARE_PROVIDER_SITE_OTHER): Payer: BC Managed Care – PPO | Admitting: Internal Medicine

## 2019-07-20 VITALS — BP 122/90 | HR 60 | Temp 98.4°F | Ht 65.0 in | Wt 174.0 lb

## 2019-07-20 DIAGNOSIS — M25511 Pain in right shoulder: Secondary | ICD-10-CM

## 2019-07-20 MED ORDER — METHYLPREDNISOLONE ACETATE 40 MG/ML IJ SUSP
40.0000 mg | Freq: Once | INTRAMUSCULAR | Status: AC
  Administered 2019-07-20: 40 mg via INTRAMUSCULAR

## 2019-07-20 NOTE — Patient Instructions (Signed)
We have given you a shot today to help with the shoulder.   It is okay to take ibuprofen also to help with the pain if needed the next couple of days.

## 2019-07-20 NOTE — Assessment & Plan Note (Signed)
Given depo-medrol 40 mg IM and advised to rest if possible for a few days and use ibuprofen if needed for pain.

## 2019-07-20 NOTE — Progress Notes (Signed)
   Subjective:   Patient ID: Andrew Bailey, male    DOB: 03-19-1976, 43 y.o.   MRN: [id number]  HPI The patient is a 43 YO man coming in for right shoulder pain. Started several weeks ago. Denies numbness or weakness in the right arm. 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 He did have similar left arm pain which is now gone. Has tried ibuprofen which helped last night. This morning not hurting with rest or light activity but still hurts with throwing motion. Overall it is stable. 10/10 pain when it hurts. Is not hurting currently.   Review of Systems  Constitutional: Negative.   HENT: Negative.   Eyes: Negative.   Respiratory: Negative for cough, chest tightness and shortness of breath.   Cardiovascular: Negative for chest pain, palpitations and leg swelling.  Gastrointestinal: Negative for abdominal distention, abdominal pain, constipation, diarrhea, nausea and vomiting.  Musculoskeletal: Positive for arthralgias and myalgias. Negative for back pain, gait problem, joint swelling, neck pain and neck stiffness.  Skin: Negative.   Neurological: Negative.   Psychiatric/Behavioral: Negative.     Objective:  Physical Exam Constitutional:      Appearance: He is well-developed.  HENT:     Head: Normocephalic and atraumatic.  Neck:     Musculoskeletal: Normal range of motion.  Cardiovascular:     Rate and Rhythm: Normal rate and regular rhythm.  Pulmonary:     Effort: Pulmonary effort is normal. No respiratory distress.     Breath sounds: Normal breath sounds. No wheezing or rales.  Abdominal:     General: Bowel sounds are normal. There is no distension.     Palpations: Abdomen is soft.     Tenderness: There is no abdominal tenderness. There is no rebound.  Musculoskeletal:        General: Tenderness present.     Comments: Mild tenderness along the biceps tendon, no pain over AC joint or scapular region  Skin:  General: Skin is warm and dry.  Neurological:     Mental Status: He is alert and oriented to person, place, and time.     Coordination: Coordination normal.     Vitals:   07/20/19 1101  BP: 122/90  Pulse: 60  Temp: 98.4 F (36.9 C)  TempSrc: Oral  SpO2: 97%  Weight: 174 lb (78.9 kg)  Height: 5\' 5"  (1.651 m)    Assessment & Plan:  Depo-medrol 40 mg IM given at visit

## 2019-09-30 DIAGNOSIS — L988 Other specified disorders of the skin and subcutaneous tissue: Secondary | ICD-10-CM | POA: Diagnosis not present

## 2019-09-30 DIAGNOSIS — D369 Benign neoplasm, unspecified site: Secondary | ICD-10-CM | POA: Diagnosis not present

## 2019-10-01 ENCOUNTER — Ambulatory Visit: Payer: BC Managed Care – PPO | Attending: Internal Medicine

## 2019-10-01 DIAGNOSIS — U071 COVID-19: Secondary | ICD-10-CM

## 2019-10-01 DIAGNOSIS — R238 Other skin changes: Secondary | ICD-10-CM | POA: Diagnosis not present

## 2019-10-02 LAB — NOVEL CORONAVIRUS, NAA: SARS-CoV-2, NAA: NOT DETECTED

## 2020-01-29 ENCOUNTER — Other Ambulatory Visit (INDEPENDENT_AMBULATORY_CARE_PROVIDER_SITE_OTHER): Payer: BC Managed Care – PPO

## 2020-01-29 ENCOUNTER — Other Ambulatory Visit: Payer: Self-pay

## 2020-01-29 ENCOUNTER — Ambulatory Visit (INDEPENDENT_AMBULATORY_CARE_PROVIDER_SITE_OTHER): Payer: BC Managed Care – PPO | Admitting: Internal Medicine

## 2020-01-29 ENCOUNTER — Encounter: Payer: Self-pay | Admitting: Internal Medicine

## 2020-01-29 VITALS — BP 130/80 | HR 74 | Temp 98.0°F | Wt 184.0 lb

## 2020-01-29 DIAGNOSIS — R21 Rash and other nonspecific skin eruption: Secondary | ICD-10-CM

## 2020-01-29 DIAGNOSIS — E559 Vitamin D deficiency, unspecified: Secondary | ICD-10-CM

## 2020-01-29 DIAGNOSIS — M25512 Pain in left shoulder: Secondary | ICD-10-CM | POA: Diagnosis not present

## 2020-01-29 DIAGNOSIS — R739 Hyperglycemia, unspecified: Secondary | ICD-10-CM

## 2020-01-29 DIAGNOSIS — R7989 Other specified abnormal findings of blood chemistry: Secondary | ICD-10-CM

## 2020-01-29 DIAGNOSIS — Z0000 Encounter for general adult medical examination without abnormal findings: Secondary | ICD-10-CM

## 2020-01-29 DIAGNOSIS — E538 Deficiency of other specified B group vitamins: Secondary | ICD-10-CM

## 2020-01-29 LAB — URINALYSIS, ROUTINE W REFLEX MICROSCOPIC
Bilirubin Urine: NEGATIVE
Ketones, ur: NEGATIVE
Leukocytes,Ua: NEGATIVE
Nitrite: NEGATIVE
Specific Gravity, Urine: 1.025 (ref 1.000–1.030)
Urine Glucose: NEGATIVE
Urobilinogen, UA: 1 (ref 0.0–1.0)
pH: 6.5 (ref 5.0–8.0)

## 2020-01-29 LAB — HEPATIC FUNCTION PANEL
ALT: 32 U/L (ref 0–53)
AST: 21 U/L (ref 0–37)
Albumin: 4.3 g/dL (ref 3.5–5.2)
Alkaline Phosphatase: 63 U/L (ref 39–117)
Bilirubin, Direct: 0.1 mg/dL (ref 0.0–0.3)
Total Bilirubin: 0.5 mg/dL (ref 0.2–1.2)
Total Protein: 7.5 g/dL (ref 6.0–8.3)

## 2020-01-29 LAB — BASIC METABOLIC PANEL
BUN: 14 mg/dL (ref 6–23)
CO2: 31 mEq/L (ref 19–32)
Calcium: 9.2 mg/dL (ref 8.4–10.5)
Chloride: 102 mEq/L (ref 96–112)
Creatinine, Ser: 1.2 mg/dL (ref 0.40–1.50)
GFR: 79.3 mL/min (ref >60.00)
Glucose, Bld: 79 mg/dL (ref 70–99)
Potassium: 4 mEq/L (ref 3.5–5.1)
Sodium: 138 mEq/L (ref 135–145)

## 2020-01-29 LAB — LIPID PANEL
Cholesterol: 188 mg/dL (ref 0–200)
HDL: 43.7 mg/dL (ref >39.00)
LDL Cholesterol: 130 mg/dL — ABNORMAL HIGH (ref 0–99)
NonHDL: 144.22
Total CHOL/HDL Ratio: 4
Triglycerides: 71 mg/dL (ref 0.0–149.0)
VLDL: 14.2 mg/dL (ref 0.0–40.0)

## 2020-01-29 LAB — CBC WITH DIFFERENTIAL/PLATELET
Basophils Absolute: 0 10*3/uL (ref 0.0–0.1)
Basophils Relative: 0.5 % (ref 0.0–3.0)
Eosinophils Absolute: 0.2 10*3/uL (ref 0.0–0.7)
Eosinophils Relative: 2.7 % (ref 0.0–5.0)
HCT: 51.1 % (ref 39.0–52.0)
Hemoglobin: 16.9 g/dL (ref 13.0–17.0)
Lymphocytes Relative: 36.6 % (ref 12.0–46.0)
Lymphs Abs: 2.9 10*3/uL (ref 0.7–4.0)
MCHC: 33.1 g/dL (ref 30.0–36.0)
MCV: 85.6 fl (ref 78.0–100.0)
Monocytes Absolute: 0.8 10*3/uL (ref 0.1–1.0)
Monocytes Relative: 10.1 % (ref 3.0–12.0)
Neutro Abs: 4 10*3/uL (ref 1.4–7.7)
Neutrophils Relative %: 50.1 % (ref 43.0–77.0)
Platelets: 195 10*3/uL (ref 150.0–400.0)
RBC: 5.97 Mil/uL — ABNORMAL HIGH (ref 4.22–5.81)
RDW: 13.1 % (ref 11.5–15.5)
WBC: 7.9 10*3/uL (ref 4.0–10.5)

## 2020-01-29 MED ORDER — TRIAMCINOLONE ACETONIDE 0.1 % EX CREA: 1.0000 "application " | TOPICAL_CREAM | Freq: Two times a day (BID) | CUTANEOUS | 2 refills | Status: AC

## 2020-01-29 NOTE — Assessment & Plan Note (Signed)

## 2020-01-29 NOTE — Progress Notes (Signed)
Subjective:    Patient ID: Andrew Bailey, male    DOB: 05/04/1976, 44 y.o.   MRN: [id number]  HPI  Here for wellness and f/u;  Overall doing ok;  Pt denies Chest pain, worsening SOB, DOE, wheezing, orthopnea, PND, worsening LE edema, palpitations, dizziness or syncope.  Pt denies neurological change such as new headache, facial or extremity weakness.  Pt denies polydipsia, polyuria, or low sugar symptoms. Pt states overall good compliance with treatment and medications, good tolerability, and has been trying to follow appropriate diet.  Pt denies worsening depressive symptoms, suicidal ideation or panic. No fever, night sweats, wt loss, loss of appetite, or other constitutional symptoms.  Pt states good ability with ADL's, has low fall risk, home safety reviewed and adequate, no other significant changes in hearing or vision, and only occasionally active with exercise.  S/p covid vaccine x 2 - last apr 3, with fatigue and mild discomfort now resolved aftre 1-2 days. Wt Readings from Last 3 Encounters:  01/29/20 184 lb (83.5 kg)  07/20/19 174 lb (78.9 kg)  05/21/19 176 lb (79.8 kg)  Also with 3 mo worsening left shoulder pain mild to mod recurrent worse to throw a ball.  Also with chronic ongoing areas of dry skin flaky scaly to left anterior thigh, similar to daughter eczema Past Medical History:  Diagnosis Date  . Allergic conjunctivitis of both eyes 01/29/2012  . Allergic rhinitis, cause unspecified   . Allergy   . Asthma   . GERD (gastroesophageal reflux disease) 01/14/2019  . HLD (hyperlipidemia) 01/14/2019   History reviewed. No pertinent surgical history.  reports that he has never smoked. He has never used smokeless tobacco. He reports that he does not drink alcohol or use drugs. family history includes Cancer in an other family member; Diabetes in his father, mother, and other family members; Heart disease in his father and another family member; Hypertension in his sister. Allergies    Allergen Reactions  . Fish Allergy Itching    Top of mouth  . Other Other (See Comments)    Grass causes seasonal allergies   Current Outpatient Medications on File Prior to Visit  Medication Sig Dispense Refill  . fluticasone (FLONASE) 50 MCG/ACT nasal spray Place into the nose.    Marland Kitchen azelastine (OPTIVAR) 0.05 % ophthalmic solution Place 1 drop into both eyes 2 (two) times daily. (Patient not taking: Reported on 01/29/2020) 6 mL 12  . fexofenadine (ALLEGRA) 180 MG tablet Take 1 tablet (180 mg total) by mouth daily. 30 tablet 11  . ibuprofen (ADVIL,MOTRIN) 400 MG tablet Take 400 mg by mouth every 6 (six) hours as needed for pain.    Marland Kitchen triamcinolone (NASACORT AQ) 55 MCG/ACT AERO nasal inhaler Place 2 sprays into the nose daily. (Patient not taking: Reported on 01/29/2020) 1 Inhaler 12   No current facility-administered medications on file prior to visit.   Review of Systems All otherwise neg per pt     Objective:   Physical Exam BP 130/80 (BP Location: Left Arm, Patient Position: Sitting, Cuff Size: Large)   Pulse 74   Temp 98 F (36.7 C) (Oral)   Wt 184 lb (83.5 kg)   SpO2 97%   BMI 30.62 kg/m  VS noted,  Constitutional: Pt appears in NAD HENT: Head: NCAT.  Right Ear: External ear normal.  Left Ear: External ear normal.  Eyes: . Pupils are equal, round, and reactive to light. Conjunctivae and EOM are normal Nose: without d/c or deformity Neck:  Neck supple. Gross normal ROM Cardiovascular: Normal rate and regular rhythm.   Pulmonary/Chest: Effort normal and breath sounds without rales or wheezing.  Abd:  Soft, NT, ND, + BS, no organomegaly Neurological: Pt is alert. At baseline orientation, motor grossly intact Skin: Skin is warm. No rashes, other new lesions, no LE edema Psychiatric: Pt behavior is normal without agitation  All otherwise neg per pt Lab Results  Component Value Date   WBC 6.9 01/14/2019   HGB 16.4 01/14/2019   HCT 49.9 01/14/2019   PLT 197 01/14/2019    GLUCOSE 124 (H) 01/14/2019   CHOL 183 02/02/2019   TRIG 58.0 02/02/2019   HDL 39.60 02/02/2019   LDLCALC 132 (H) 02/02/2019   ALT 29 02/02/2019   AST 21 02/02/2019   NA 136 01/14/2019   K 3.8 01/14/2019   CL 105 01/14/2019   CREATININE 1.09 01/14/2019   BUN 15 01/14/2019   CO2 25 01/14/2019   TSH 1.10 02/02/2019   PSA 0.59 02/02/2019   HGBA1C 6.1 02/02/2019      Assessment & Plan:

## 2020-01-29 NOTE — Assessment & Plan Note (Signed)
stable overall by history and exam, recent data reviewed with pt, and pt to continue medical treatment as before,  to f/u any worsening symptoms or concerns  

## 2020-01-29 NOTE — Assessment & Plan Note (Signed)
Mild to mod, for triam cr prn, to f/u any worsening symptoms or concerns

## 2020-01-29 NOTE — Patient Instructions (Signed)
Please take all new medication as prescribed - the mild steroid cream  Please continue all other medications as before, and refills have been done if requested.  Please have the pharmacy call with any other refills you may need.  Please continue your efforts at being more active, low cholesterol diet, and weight control.  You are otherwise up to date with prevention measures today.  Please keep your appointments with your specialists as you may have planned  You will be contacted regarding the referral for: Sports Medicine  Please go to the LAB at the blood drawing area for the tests to be done at the [place] will be contacted by phone if any changes need to be made immediately.  Otherwise, you will receive a letter about your results with an explanation, but please check with MyChart first.  Please remember to sign up for MyChart if you have not done so, as this will be important to you in the future with finding out test results, communicating by private email, and scheduling acute appointments online when needed.  Please make an Appointment to return for your 1 year visit, or sooner if needed

## 2020-01-29 NOTE — Assessment & Plan Note (Signed)
Suspect prob left rot cuff abnormal - for sport med referral

## 2020-02-01 ENCOUNTER — Other Ambulatory Visit: Payer: Self-pay | Admitting: Internal Medicine

## 2020-02-01 LAB — VITAMIN B12: Vitamin B-12: 509 pg/mL (ref 211–911)

## 2020-02-01 LAB — VITAMIN D 25 HYDROXY (VIT D DEFICIENCY, FRACTURES): VITD: 24.56 ng/mL — ABNORMAL LOW (ref 30.00–100.00)

## 2020-02-01 LAB — TSH: TSH: 2.68 u[IU]/mL (ref 0.35–4.50)

## 2020-02-01 MED ORDER — VITAMIN D (ERGOCALCIFEROL) 1.25 MG (50000 UNIT) PO CAPS: 50000.0000 [IU] | ORAL_CAPSULE | ORAL | 0 refills | Status: DC

## 2020-02-05 ENCOUNTER — Ambulatory Visit: Payer: BC Managed Care – PPO | Admitting: Family Medicine

## 2020-02-08 ENCOUNTER — Ambulatory Visit (INDEPENDENT_AMBULATORY_CARE_PROVIDER_SITE_OTHER): Payer: BC Managed Care – PPO | Admitting: Family Medicine

## 2020-02-08 ENCOUNTER — Ambulatory Visit (INDEPENDENT_AMBULATORY_CARE_PROVIDER_SITE_OTHER): Payer: BC Managed Care – PPO

## 2020-02-08 ENCOUNTER — Ambulatory Visit: Payer: Self-pay

## 2020-02-08 ENCOUNTER — Other Ambulatory Visit: Payer: Self-pay

## 2020-02-08 ENCOUNTER — Encounter: Payer: Self-pay | Admitting: Family Medicine

## 2020-02-08 VITALS — BP 120/76 | HR 67 | Ht 65.0 in | Wt 182.2 lb

## 2020-02-08 DIAGNOSIS — M25512 Pain in left shoulder: Secondary | ICD-10-CM | POA: Diagnosis not present

## 2020-02-08 DIAGNOSIS — G8929 Other chronic pain: Secondary | ICD-10-CM | POA: Diagnosis not present

## 2020-02-08 NOTE — Progress Notes (Signed)
Subjective:    I'm seeing this patient as a consultation for:  Dr. Jenny Reichmann. Note will be routed back to referring provider/PCP.  CC: L shoulder pain  I, Andrew Bailey, LAT, ATC, am serving as scribe for Dr. Lynne Bailey.  HPI: Pt is a 44 y/o male presenting w/ c/o L shoulder pain x one year.  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  He locates his pain to his L anterior shoulder.  He rates his pain as mild and describes his pain as sharp.  Radiating pain: No L shoulder mechanical symptoms: No Aggravating factors: L shoulder overhead and horizontal aDd Treatments tried: Advil  Past medical history, Surgical history, Family history, Social history, Allergies, and medications have been entered into the medical record, reviewed.   Review of Systems: No new headache, visual changes, nausea, vomiting, diarrhea, constipation, dizziness, abdominal pain, skin rash, fevers, chills, night sweats, weight loss, swollen lymph nodes, body aches, joint swelling, muscle aches, chest pain, shortness of breath, mood changes, visual or auditory hallucinations.   Objective:    Vitals:   02/08/20 0807  BP: 120/76  Pulse: 67  SpO2: 96%   General: Well Developed, well nourished, and in no acute distress.  Neuro/Psych: Alert and oriented x3, extra-ocular muscles intact, able to move all 4 extremities, sensation grossly intact. Skin: Warm and dry, no rashes noted.  Respiratory: Not using accessory muscles, speaking in full sentences, trachea midline.  Cardiovascular: Pulses palpable, no extremity edema. Abdomen: Does not appear distended. MSK: C-spine normal-appearing normal motion. Left shoulder normal-appearing nontender. Normal motion. Strength intact abduction external/internal rotation. Negative Hawkins and Neer's test. Negative empty can test. Minimally positive crossover  arm compression test. Negative O'Brien's test. Negative clunk or relocation test. Pulses capillary fill and sensation are intact distally.  Lab and Radiology Results  DG Shoulder Left  Result Date: 02/08/2020 CLINICAL DATA:  44 year old male with a history left shoulder pain EXAM: LEFT SHOULDER - 2+ VIEW COMPARISON:  None. FINDINGS: There is no evidence of fracture or dislocation. There is no evidence of arthropathy or other focal bone abnormality. Soft tissues are unremarkable. IMPRESSION: Negative for acute bony abnormality. Electronically Signed   By: Corrie Mckusick D.O.   On: 02/08/2020 09:48   I, Andrew Bailey, personally (independently) visualized and performed the interpretation of the images attached in this note.   Diagnostic Limited MSK Ultrasound of: Left shoulder Biceps tendon intact normal-appearing in bicipital groove. Subscapularis tendon normal-appearing Supraspinatus tendon intact normal-appearing Slight increased subacromial bursa thickness on ultrasound. Infraspinatus tendon normal-appearing AC joint normal-appearing Impression: Mild subacromial bursitis   Impression and Recommendations:    Assessment and Plan: 44 y.o. male with left shoulder pain. Chronic mild pain ongoing for a year. Most likely diagnosis at this point is mild subacromial bursitis. 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 Plan for shoulder stabilizing exercises taught in clinic today by ATC along with referral to physical therapy. X-ray today shows no acute changes however radiology overread is still pending. Recheck back in about 6 weeks. Return sooner or contact me sooner if not doing well or if having a problem. If not better would consider trial of injection versus advanced imaging.  [id number]; 15 additional minutes spent for Therapeutic exercises as stated in above notes.  This included exercises focusing on stretching,  strengthening, with significant focus on eccentric aspects.   Long term goals include an improvement in range of motion, strength, endurance as well as avoiding reinjury. Patient's frequency would include in 1-2 times a day, 3-5 times a week for a duration of 6-12 weeks.  Proper technique shown and discussed handout in great detail with ATC.  All questions were discussed and answered.    Orders Placed This Encounter  Procedures  . [place] LIMITED JOINT SPACE STRUCTURES UP LEFT(NO LINKED CHARGES)    Order Specific Question:   Reason for Exam (SYMPTOM  OR DIAGNOSIS REQUIRED)    Answer:   L shoulder pain    Order Specific Question:   Preferred imaging location?    Answer:   [place]  . DG Shoulder Left    Standing Status:   Future    Number of Occurrences:   1    Standing Expiration Date:   04/09/2021    Order Specific Question:   Reason for Exam (SYMPTOM  OR DIAGNOSIS REQUIRED)    Answer:   eval lt shoulder pain    Order Specific Question:   Preferred imaging location?    Answer:   [name]    Order Specific Question:   Radiology Contrast Protocol - do NOT remove file path    Answer:   [email or web address]  . Ambulatory referral to Physical Therapy    Referral Priority:   Routine    Referral Type:   Physical Medicine    Referral Reason:   Specialty Services Required    Requested Specialty:   Physical Therapy   No orders of the defined types were placed in this encounter.   Discussed warning signs or symptoms. Please see discharge instructions. Patient expresses understanding.   The above documentation has been reviewed and is accurate and complete Andrew Bailey

## 2020-02-08 NOTE — Patient Instructions (Addendum)
Thank you for coming in today. Attend PT.  Do the home exercises.  Recheck in 6 weeks.  Ok to take ibuprofen or aleve as needed.  Please get an XR on your way out.   Please perform the exercise program that we have prepared for you and gone over in detail on a daily basis.  In addition to the handout you were provided you can access your program through: [email or web address]  [id number] Your unique program code is:

## 2020-02-17 ENCOUNTER — Ambulatory Visit: Payer: BC Managed Care – PPO | Admitting: Physical Therapy

## 2020-03-03 IMAGING — DX PORTABLE CHEST - 1 VIEW
1 series · 1 of 1 positions shown · non-contrast
Comparison: November 29, 2015

CLINICAL DATA: Tachycardia

EXAM:
PORTABLE CHEST 1 VIEW

[chest ap]
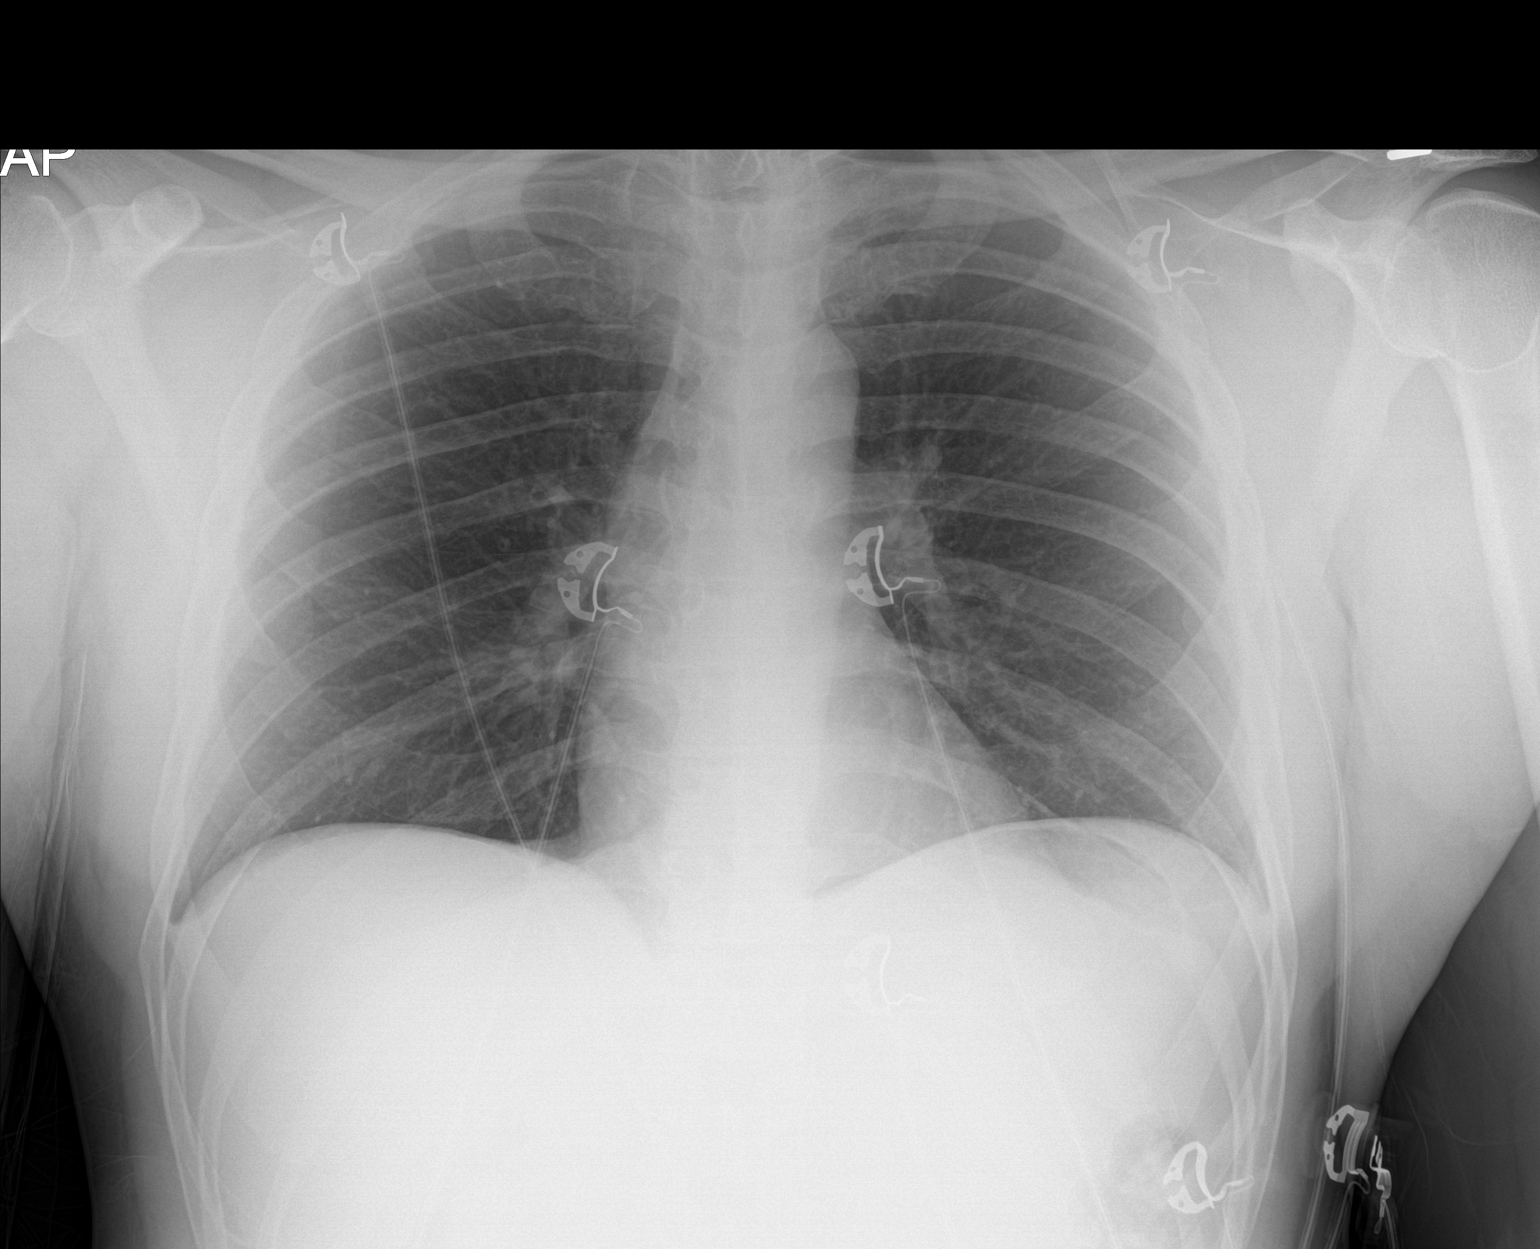

[1 of 1 positions shown; findings below may reference images not displayed]

FINDINGS: Lungs are clear. Heart size and pulmonary vascularity are normal. No
adenopathy. No bone lesions.
IMPRESSION: No edema or consolidation.

## 2020-03-21 ENCOUNTER — Ambulatory Visit: Payer: BC Managed Care – PPO | Admitting: Family Medicine

## 2020-03-21 NOTE — Progress Notes (Deleted)
   Rito Ehrlich, am serving as a Education administrator for Dr. Lynne Leader.  Andrew Bailey is a 44 y.o. male who presents to [facility] today for Follow up of L shoulder pain. Patient was last seen by Dr. 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  He locates his pain to his L anterior shoulder.  He rates his pain as mild and describes his pain as sharp. Since last visit patient reports    Pertinent review of systems: ***  Relevant historical information: ***   Exam:  There were no vitals taken for this visit. General: Well Developed, well nourished, and in no acute distress.   MSK: ***    Lab and Radiology Results No results found for this or any previous visit (from the past 72 hour(s)). No results found.     Assessment and Plan: 44 y.o. male with ***   PDMP not reviewed this encounter. No orders of the defined types were placed in this encounter.  No orders of the defined types were placed in this encounter.    Discussed warning signs or symptoms. Please see discharge instructions. Patient expresses understanding.   ***

## 2021-01-31 ENCOUNTER — Other Ambulatory Visit: Payer: Self-pay

## 2021-02-01 ENCOUNTER — Encounter: Payer: BC Managed Care – PPO | Admitting: Internal Medicine

## 2021-02-08 ENCOUNTER — Encounter: Payer: Self-pay | Admitting: Internal Medicine

## 2021-02-08 ENCOUNTER — Other Ambulatory Visit: Payer: Self-pay

## 2021-02-08 ENCOUNTER — Ambulatory Visit (INDEPENDENT_AMBULATORY_CARE_PROVIDER_SITE_OTHER): Payer: BC Managed Care – PPO | Admitting: Internal Medicine

## 2021-02-08 ENCOUNTER — Other Ambulatory Visit: Payer: Self-pay | Admitting: Internal Medicine

## 2021-02-08 VITALS — BP 150/90 | HR 65 | Ht 65.0 in | Wt 181.0 lb

## 2021-02-08 DIAGNOSIS — E538 Deficiency of other specified B group vitamins: Secondary | ICD-10-CM

## 2021-02-08 DIAGNOSIS — R739 Hyperglycemia, unspecified: Secondary | ICD-10-CM

## 2021-02-08 DIAGNOSIS — R5383 Other fatigue: Secondary | ICD-10-CM | POA: Diagnosis not present

## 2021-02-08 DIAGNOSIS — E7800 Pure hypercholesterolemia, unspecified: Secondary | ICD-10-CM | POA: Diagnosis not present

## 2021-02-08 DIAGNOSIS — Z0001 Encounter for general adult medical examination with abnormal findings: Secondary | ICD-10-CM | POA: Diagnosis not present

## 2021-02-08 DIAGNOSIS — E559 Vitamin D deficiency, unspecified: Secondary | ICD-10-CM | POA: Insufficient documentation

## 2021-02-08 DIAGNOSIS — R030 Elevated blood-pressure reading, without diagnosis of hypertension: Secondary | ICD-10-CM

## 2021-02-08 LAB — HEPATIC FUNCTION PANEL
ALT: 24 U/L (ref 0–53)
AST: 19 U/L (ref 0–37)
Albumin: 4.3 g/dL (ref 3.5–5.2)
Alkaline Phosphatase: 65 U/L (ref 39–117)
Bilirubin, Direct: 0.1 mg/dL (ref 0.0–0.3)
Total Bilirubin: 0.6 mg/dL (ref 0.2–1.2)
Total Protein: 7.2 g/dL (ref 6.0–8.3)

## 2021-02-08 LAB — CBC WITH DIFFERENTIAL/PLATELET
Basophils Absolute: 0.1 10*3/uL (ref 0.0–0.1)
Basophils Relative: 0.8 % (ref 0.0–3.0)
Eosinophils Absolute: 0.2 10*3/uL (ref 0.0–0.7)
Eosinophils Relative: 2.6 % (ref 0.0–5.0)
HCT: 49.3 % (ref 39.0–52.0)
Hemoglobin: 16.3 g/dL (ref 13.0–17.0)
Lymphocytes Relative: 36.2 % (ref 12.0–46.0)
Lymphs Abs: 2.4 10*3/uL (ref 0.7–4.0)
MCHC: 33 g/dL (ref 30.0–36.0)
MCV: 85.4 fl (ref 78.0–100.0)
Monocytes Absolute: 0.7 10*3/uL (ref 0.1–1.0)
Monocytes Relative: 9.9 % (ref 3.0–12.0)
Neutro Abs: 3.3 10*3/uL (ref 1.4–7.7)
Neutrophils Relative %: 50.5 % (ref 43.0–77.0)
Platelets: 192 10*3/uL (ref 150.0–400.0)
RBC: 5.78 Mil/uL (ref 4.22–5.81)
RDW: 13.7 % (ref 11.5–15.5)
WBC: 6.6 10*3/uL (ref 4.0–10.5)

## 2021-02-08 LAB — BASIC METABOLIC PANEL
BUN: 18 mg/dL (ref 6–23)
CO2: 28 mEq/L (ref 19–32)
Calcium: 9.2 mg/dL (ref 8.4–10.5)
Chloride: 102 mEq/L (ref 96–112)
Creatinine, Ser: 1.11 mg/dL (ref 0.40–1.50)
GFR: 80.02 mL/min (ref >60.00)
Glucose, Bld: 96 mg/dL (ref 70–99)
Potassium: 4.2 mEq/L (ref 3.5–5.1)
Sodium: 137 mEq/L (ref 135–145)

## 2021-02-08 LAB — VITAMIN B12: Vitamin B-12: 437 pg/mL (ref 211–911)

## 2021-02-08 LAB — URINALYSIS, ROUTINE W REFLEX MICROSCOPIC
Bilirubin Urine: NEGATIVE
Ketones, ur: NEGATIVE
Leukocytes,Ua: NEGATIVE
Nitrite: NEGATIVE
Specific Gravity, Urine: 1.025 (ref 1.000–1.030)
Total Protein, Urine: NEGATIVE
Urine Glucose: NEGATIVE
Urobilinogen, UA: 0.2 (ref 0.0–1.0)
pH: 6 (ref 5.0–8.0)

## 2021-02-08 LAB — HEMOGLOBIN A1C: Hgb A1c MFr Bld: 5.8 % (ref 4.6–6.5)

## 2021-02-08 LAB — LIPID PANEL
Cholesterol: 197 mg/dL (ref 0–200)
HDL: 41.6 mg/dL (ref >39.00)
LDL Cholesterol: 130 mg/dL — ABNORMAL HIGH (ref 0–99)
NonHDL: 155.82
Total CHOL/HDL Ratio: 5
Triglycerides: 129 mg/dL (ref 0.0–149.0)
VLDL: 25.8 mg/dL (ref 0.0–40.0)

## 2021-02-08 LAB — TSH: TSH: 1.78 u[IU]/mL (ref 0.35–4.50)

## 2021-02-08 LAB — TESTOSTERONE: Testosterone: 425.53 ng/dL (ref 300.00–890.00)

## 2021-02-08 LAB — PSA: PSA: 0.7 ng/mL (ref 0.10–4.00)

## 2021-02-08 LAB — VITAMIN D 25 HYDROXY (VIT D DEFICIENCY, FRACTURES): VITD: 27.3 ng/mL — ABNORMAL LOW (ref 30.00–100.00)

## 2021-02-08 MED ORDER — THERA-D 2000 50 MCG (2000 UT) PO TABS: ORAL_TABLET | ORAL | 99 refills | Status: DC

## 2021-02-08 NOTE — Progress Notes (Signed)
Patient ID: Andrew Bailey, male   DOB: 04-12-1976, 45 y.o.   MRN: [id number]         Chief Complaint:: wellness exam andelevated BP, low vit d, and fatigue       HPI:  Andrew Bailey is a 45 y.o. male here for wellness exam; due for colonoscopy, o/w up to date with preventive referrals and immunizations                        Also BP at home has been < 140/90, not taking Vit D, and Does c/o ongoing fatigue, but denies signficant daytime hypersomnolence.  Pt denies chest pain, increased sob or doe, wheezing, orthopnea, PND, increased LE swelling, palpitations, dizziness or syncope.   Pt denies polydipsia, polyuria, or new neuro focal s/s.   Pt denies fever, wt loss, night sweats, loss of appetite, or other constitutional symptoms  No other new complaints   Wt Readings from Last 3 Encounters:  02/08/21 181 lb (82.1 kg)  02/08/20 182 lb 3.2 oz (82.6 kg)  01/29/20 184 lb (83.5 kg)   BP Readings from Last 3 Encounters:  02/08/21 (!) 150/90  02/08/20 120/76  01/29/20 130/80   Immunization History  Administered Date(s) Administered  . Influenza,inj,Quad PF,6+ Mos 10/07/2013, 12/08/2014  . PFIZER(Purple Top)SARS-COV-2 Vaccination 12/19/2019, 01/09/2020, 07/18/2020  . Tdap 10/07/2013   Health Maintenance Due  Topic Date Due  . COLONOSCOPY (Pts 45-46yrs Insurance coverage will need to be confirmed)  Never done      Past Medical History:  Diagnosis Date  . Allergic conjunctivitis of both eyes 01/29/2012  . Allergic rhinitis, cause unspecified   . Allergy   . Asthma   . GERD (gastroesophageal reflux disease) 01/14/2019  . HLD (hyperlipidemia) 01/14/2019   History reviewed. No pertinent surgical history.  reports that he has never smoked. He has never used smokeless tobacco. He reports that he does not drink alcohol and does not use drugs. family history includes Cancer in an other family member; Diabetes in his father, mother, and other family members; Heart disease in his father and  another family member; Hypertension in his sister. Allergies  Allergen Reactions  . Fish Allergy Itching    Top of mouth  . Other Other (See Comments)    Grass causes seasonal allergies   Current Outpatient Medications on File Prior to Visit  Medication Sig Dispense Refill  . fluticasone (FLONASE) 50 MCG/ACT nasal spray Place into the nose.    . ibuprofen (ADVIL,MOTRIN) 400 MG tablet Take 400 mg by mouth every 6 (six) hours as needed for pain.    . Vitamin D, Ergocalciferol, (DRISDOL) 1.25 MG (50000 UNIT) CAPS capsule Take 1 capsule (50,000 Units total) by mouth every 7 (seven) days. 12 capsule 0  . fexofenadine (ALLEGRA) 180 MG tablet Take 1 tablet (180 mg total) by mouth daily. 30 tablet 11   No current facility-administered medications on file prior to visit.        ROS:  All others reviewed and negative.  Objective        PE:  BP (!) 150/90 (BP Location: Left Arm, Patient Position: Sitting, Cuff Size: Large)   Pulse 65   Ht 5\' 5"  (1.651 m)   Wt 181 lb (82.1 kg)   SpO2 97%   BMI 30.12 kg/m                 Constitutional: Pt appears in NAD  HENT: Head: NCAT.                Right Ear: External ear normal.                 Left Ear: External ear normal.                Eyes: . Pupils are equal, round, and reactive to light. Conjunctivae and EOM are normal               Nose: without d/c or deformity               Neck: Neck supple. Gross normal ROM               Cardiovascular: Normal rate and regular rhythm.                 Pulmonary/Chest: Effort normal and breath sounds without rales or wheezing.                Abd:  Soft, NT, ND, + BS, no organomegaly               Neurological: Pt is alert. At baseline orientation, motor grossly intact               Skin: Skin is warm. No rashes, no other new lesions, LE edema - none               Psychiatric: Pt behavior is normal without agitation   Micro: none  Cardiac tracings I have personally interpreted today:   none  Pertinent Radiological findings (summarize): none   Lab Results  Component Value Date   WBC 6.6 02/08/2021   HGB 16.3 02/08/2021   HCT 49.3 02/08/2021   PLT 192.0 02/08/2021   GLUCOSE 96 02/08/2021   CHOL 197 02/08/2021   TRIG 129.0 02/08/2021   HDL 41.60 02/08/2021   LDLCALC 130 (H) 02/08/2021   ALT 24 02/08/2021   AST 19 02/08/2021   NA 137 02/08/2021   K 4.2 02/08/2021   CL 102 02/08/2021   CREATININE 1.11 02/08/2021   BUN 18 02/08/2021   CO2 28 02/08/2021   TSH 1.78 02/08/2021   PSA 0.70 02/08/2021   HGBA1C 5.8 02/08/2021   Assessment/Plan:  Andrew Bailey is a 45 y.o. Black or African American [2] male with  has a past medical history of Allergic conjunctivitis of both eyes (01/29/2012), Allergic rhinitis, cause unspecified, Allergy, Asthma, GERD (gastroesophageal reflux disease) (01/14/2019), and HLD (hyperlipidemia) (01/14/2019).  Encounter for well adult exam with abnormal findings Age and sex appropriate education and counseling updated with regular exercise and diet Referrals for preventative services - for colonoscopy Immunizations addressed - none needed Smoking counseling  - none needed Evidence for depression or other mood disorder - none significant Most recent labs reviewed. I have personally reviewed and have noted: 1) the patient's medical and social history 2) The patient's current medications and supplements 3) The patient's height, weight, and BMI have been recorded in the chart   Vitamin D deficiency Last vitamin D Lab Results  Component Value Date   VD25OH 27.30 (L) 02/08/2021   Low to start oral replacement  Hyperglycemia Lab Results  Component Value Date   HGBA1C 5.8 02/08/2021   Stable, pt to continue current medical treatment  - diet   HLD (hyperlipidemia) Lab Results  Component Value Date   LDLCALC 130 (H) 02/08/2021   Stable, pt to continue current low chol diet, - declines  statin for now    Current Outpatient  Medications (Respiratory):  .  fluticasone (FLONASE) 50 MCG/ACT nasal spray, Place into the nose. .  fexofenadine (ALLEGRA) 180 MG tablet, Take 1 tablet (180 mg total) by mouth daily.  Current Outpatient Medications (Analgesics):  .  ibuprofen (ADVIL,MOTRIN) 400 MG tablet, Take 400 mg by mouth every 6 (six) hours as needed for pain.   Current Outpatient Medications (Other):  Marland Kitchen  Vitamin D, Ergocalciferol, (DRISDOL) 1.25 MG (50000 UNIT) CAPS capsule, Take 1 capsule (50,000 Units total) by mouth every 7 (seven) days. .  Cholecalciferol (THERA-D 2000) 50 MCG (2000 UT) TABS, 1 tab by mouth once daily   Blood pressure elevated without history of HTN Mild, to check bp at home and call in 10 days with average  Followup: Return in about 1 year (around 02/08/2022).  Cathlean Cower, MD 02/11/2021 8:46 PM [facility]

## 2021-02-08 NOTE — Patient Instructions (Signed)
Please take OTC Vitamin D3 at 2000 units per day, indefinitely.  Please check your BP at home twice per day for 10 days and let us know the average by Mychart message  Please continue all other medications as before, and refills have been done if requested.  Please have the pharmacy call with any other refills you may need.  Please continue your efforts at being more active, low cholesterol diet, and weight control.  You are otherwise up to date with prevention measures today.  Please keep your appointments with your specialists as you may have planned  Please go to the LAB at the blood drawing area for the tests to be done  You will be contacted by phone if any changes need to be made immediately.  Otherwise, you will receive a letter about your results with an explanation, but please check with MyChart first.  Please remember to sign up for MyChart if you have not done so, as this will be important to you in the future with finding out test results, communicating by private email, and scheduling acute appointments online when needed.  Please make an Appointment to return for your 1 year visit, or sooner if needed

## 2021-02-11 ENCOUNTER — Encounter: Payer: Self-pay | Admitting: Internal Medicine

## 2021-02-11 DIAGNOSIS — R5383 Other fatigue: Secondary | ICD-10-CM | POA: Insufficient documentation

## 2021-02-11 NOTE — Assessment & Plan Note (Signed)
Last vitamin D Lab Results  Component Value Date   VD25OH 27.30 (L) 02/08/2021   Low to start oral replacement

## 2021-02-11 NOTE — Assessment & Plan Note (Signed)
Lab Results  Component Value Date   LDLCALC 130 (H) 02/08/2021   Stable, pt to continue current low chol diet, - declines statin for now    Current Outpatient Medications (Respiratory):  .  fluticasone (FLONASE) 50 MCG/ACT nasal spray, Place into the nose. .  fexofenadine (ALLEGRA) 180 MG tablet, Take 1 tablet (180 mg total) by mouth daily.  Current Outpatient Medications (Analgesics):  .  ibuprofen (ADVIL,MOTRIN) 400 MG tablet, Take 400 mg by mouth every 6 (six) hours as needed for pain.   Current Outpatient Medications (Other):  Marland Kitchen  Vitamin D, Ergocalciferol, (DRISDOL) 1.25 MG (50000 UNIT) CAPS capsule, Take 1 capsule (50,000 Units total) by mouth every 7 (seven) days. .  Cholecalciferol (THERA-D 2000) 50 MCG (2000 UT) TABS, 1 tab by mouth once daily

## 2021-02-11 NOTE — Assessment & Plan Note (Signed)

## 2021-02-11 NOTE — Assessment & Plan Note (Signed)
Mild, to check bp at home and call in 10 days with average

## 2021-02-11 NOTE — Assessment & Plan Note (Signed)
Lab Results  Component Value Date   HGBA1C 5.8 02/08/2021   Stable, pt to continue current medical treatment  - diet

## 2021-02-16 ENCOUNTER — Telehealth: Payer: Self-pay | Admitting: Internal Medicine

## 2021-02-16 DIAGNOSIS — Z1211 Encounter for screening for malignant neoplasm of colon: Secondary | ICD-10-CM

## 2021-02-16 NOTE — Telephone Encounter (Signed)
   Patient requesting referral to GI: screening colonoscopy

## 2021-02-17 NOTE — Telephone Encounter (Signed)
Ok this is done 

## 2021-02-20 NOTE — Telephone Encounter (Signed)
Patient notified

## 2021-12-29 ENCOUNTER — Telehealth: Payer: Self-pay

## 2021-12-29 DIAGNOSIS — Z1211 Encounter for screening for malignant neoplasm of colon: Secondary | ICD-10-CM

## 2021-12-29 NOTE — Telephone Encounter (Signed)
Pt is requesting another referral for a colonoscopy.  ? ?Please advise ?

## 2021-12-30 NOTE — Telephone Encounter (Signed)
Ok this is done 

## 2022-01-01 NOTE — Telephone Encounter (Signed)
Called patient to inform him that his referral for a colonoscopy was entered and that office will call him with an appointment  ?

## 2022-02-08 ENCOUNTER — Encounter: Payer: Self-pay | Admitting: Gastroenterology

## 2022-02-09 ENCOUNTER — Encounter: Payer: BC Managed Care – PPO | Admitting: Internal Medicine

## 2022-02-12 ENCOUNTER — Encounter: Payer: BC Managed Care – PPO | Admitting: Internal Medicine

## 2022-02-21 ENCOUNTER — Encounter: Payer: Self-pay | Admitting: Internal Medicine

## 2022-02-21 ENCOUNTER — Ambulatory Visit (INDEPENDENT_AMBULATORY_CARE_PROVIDER_SITE_OTHER): Payer: BC Managed Care – PPO | Admitting: Internal Medicine

## 2022-02-21 VITALS — BP 138/80 | HR 73 | Temp 98.5°F | Ht 65.0 in | Wt 175.0 lb

## 2022-02-21 DIAGNOSIS — R739 Hyperglycemia, unspecified: Secondary | ICD-10-CM

## 2022-02-21 DIAGNOSIS — E559 Vitamin D deficiency, unspecified: Secondary | ICD-10-CM | POA: Diagnosis not present

## 2022-02-21 DIAGNOSIS — E538 Deficiency of other specified B group vitamins: Secondary | ICD-10-CM

## 2022-02-21 DIAGNOSIS — Z0001 Encounter for general adult medical examination with abnormal findings: Secondary | ICD-10-CM

## 2022-02-21 DIAGNOSIS — J309 Allergic rhinitis, unspecified: Secondary | ICD-10-CM

## 2022-02-21 DIAGNOSIS — Z125 Encounter for screening for malignant neoplasm of prostate: Secondary | ICD-10-CM

## 2022-02-21 DIAGNOSIS — H1013 Acute atopic conjunctivitis, bilateral: Secondary | ICD-10-CM

## 2022-02-21 DIAGNOSIS — E7800 Pure hypercholesterolemia, unspecified: Secondary | ICD-10-CM

## 2022-02-21 MED ORDER — AZELASTINE HCL 0.05 % OP SOLN: 1.0000 [drp] | Freq: Two times a day (BID) | OPHTHALMIC | 12 refills | Status: AC

## 2022-02-21 MED ORDER — TRIAMCINOLONE ACETONIDE 55 MCG/ACT NA AERO: 2.0000 | INHALATION_SPRAY | Freq: Every day | NASAL | 12 refills | Status: AC

## 2022-02-21 MED ORDER — THERA-D 2000 50 MCG (2000 UT) PO TABS: ORAL_TABLET | ORAL | 99 refills | Status: AC

## 2022-02-21 NOTE — Progress Notes (Signed)
Patient ID: Andrew Bailey, male   DOB: October 24, 1975, 46 y.o.   MRN: [id number]         Chief Complaint:: wellness exam and low vit d, nasal and eye allergy       HPI:  Andrew Bailey is a 46 y.o. male here for wellness exam; decliens covid booster, o/w up to date                        Also has colonscopy June 16 scheduled.  Pt denies chest pain, increased sob or doe, wheezing, orthopnea, PND, increased LE swelling, palpitations, dizziness or syncope.   Pt denies polydipsia, polyuria, or new focal neuro s/s.    Pt denies fever, wt loss, night sweats, loss of appetite, or other constitutional symptoms  Not taking Vit D.  Lost wt with better diet exercise.  No new complaints except Does have several wks ongoing nasal and eye allergy symptoms with clearish congestion, itch and sneezing, without fever, pain, ST, cough, swelling or wheezing.  Wt Readings from Last 3 Encounters:  02/22/22 175 lb (79.4 kg)  02/21/22 175 lb (79.4 kg)  02/08/21 181 lb (82.1 kg)   BP Readings from Last 3 Encounters:  02/21/22 138/80  02/08/21 (!) 150/90  02/08/20 120/76   Immunization History  Administered Date(s) Administered   Influenza,inj,Quad PF,6+ Mos 10/07/2013, 12/08/2014   Influenza-Unspecified 10/07/2013, 12/08/2014   PFIZER(Purple Top)SARS-COV-2 Vaccination 12/19/2019, 01/09/2020, 07/18/2020   Tdap 10/07/2013, 03/28/2019   There are no preventive care reminders to display for this patient.     Past Medical History:  Diagnosis Date   Allergic conjunctivitis of both eyes 01/29/2012   Allergic rhinitis, cause unspecified    Allergy    Asthma    GERD (gastroesophageal reflux disease) 01/14/2019   HLD (hyperlipidemia) 01/14/2019   History reviewed. No pertinent surgical history.  reports that he has never smoked. He has never used smokeless tobacco. He reports that he does not drink alcohol and does not use drugs. family history includes Cancer in an other family member; Diabetes in his father,  mother, and other family members; Heart disease in his father and another family member; Hypertension in his sister. Allergies  Allergen Reactions   Fish Allergy Itching    Top of mouth   Other Other (See Comments)    Grass causes seasonal allergies   Current Outpatient Medications on File Prior to Visit  Medication Sig Dispense Refill   fexofenadine (ALLEGRA) 180 MG tablet Take 1 tablet (180 mg total) by mouth daily. 30 tablet 11   ibuprofen (ADVIL,MOTRIN) 400 MG tablet Take 400 mg by mouth every 6 (six) hours as needed for pain.     No current facility-administered medications on file prior to visit.        ROS:  All others reviewed and negative.  Objective        PE:  BP 138/80 (BP Location: Right Arm, Patient Position: Sitting, Cuff Size: Large)   Pulse 73   Temp 98.5 F (36.9 C) (Oral)   Ht '5\' 5"'$  (1.651 m)   Wt 175 lb (79.4 kg)   SpO2 97%   BMI 29.12 kg/m                 Constitutional: Pt appears in NAD               HENT: Head: NCAT.  Right Ear: External ear normal.                 Left Ear: External ear normal.                Eyes: . Pupils are equal, round, and reactive to light. Conjunctivae and EOM are normal               Nose: without d/c or deformity               Neck: Neck supple. Gross normal ROM               Cardiovascular: Normal rate and regular rhythm.                 Pulmonary/Chest: Effort normal and breath sounds without rales or wheezing.                Abd:  Soft, NT, ND, + BS, no organomegaly               Neurological: Pt is alert. At baseline orientation, motor grossly intact               Skin: Skin is warm. No rashes, no other new lesions, LE edema - none               Psychiatric: Pt behavior is normal without agitation   Micro: none  Cardiac tracings I have personally interpreted today:  none  Pertinent Radiological findings (summarize): none   Lab Results  Component Value Date   WBC 6.6 02/08/2021   HGB 16.3  02/08/2021   HCT 49.3 02/08/2021   PLT 192.0 02/08/2021   GLUCOSE 96 02/08/2021   CHOL 197 02/08/2021   TRIG 129.0 02/08/2021   HDL 41.60 02/08/2021   LDLCALC 130 (H) 02/08/2021   ALT 24 02/08/2021   AST 19 02/08/2021   NA 137 02/08/2021   K 4.2 02/08/2021   CL 102 02/08/2021   CREATININE 1.11 02/08/2021   BUN 18 02/08/2021   CO2 28 02/08/2021   TSH 1.78 02/08/2021   PSA 0.70 02/08/2021   HGBA1C 5.8 02/08/2021   Assessment/Plan:  Andrew Bailey is a 46 y.o. Black or African American [2] male with  has a past medical history of Allergic conjunctivitis of both eyes (01/29/2012), Allergic rhinitis, cause unspecified, Allergy, Asthma, GERD (gastroesophageal reflux disease) (01/14/2019), and HLD (hyperlipidemia) (01/14/2019).  Vitamin D deficiency Last vitamin D Lab Results  Component Value Date   VD25OH 27.30 (L) 02/08/2021   Low, to start oral replacement   Encounter for well adult exam with abnormal findings Age and sex appropriate education and counseling updated with regular exercise and diet Referrals for preventative services - none needed Immunizations addressed - declines covid booster Smoking counseling  - none needed Evidence for depression or other mood disorder - none significant Most recent labs reviewed. I have personally reviewed and have noted: 1) the patient's medical and social history 2) The patient's current medications and supplements 3) The patient's height, weight, and BMI have been recorded in the chart   Allergic rhinitis Mild to mod seasonal flare, for nasacort asd,  to f/u any worsening symptoms or concerns  HLD (hyperlipidemia) Lab Results  Component Value Date   LDLCALC 130 (H) 02/08/2021   uncontrolled, goal ldl < 100, pt to continue current low chol diet, declines statin   Hyperglycemia Lab Results  Component Value Date   HGBA1C 5.8 02/08/2021   Stable, pt to  continue current medical treatment  - diet   Allergic  conjunctivitis Mild to mod, for optivar asd,,  to f/u any worsening symptoms or concerns  Followup: Return in about 1 year (around 02/22/2023).  Cathlean Cower, MD 02/24/2022 6:11 PM [facility]

## 2022-02-21 NOTE — Assessment & Plan Note (Signed)
Last vitamin D ?Lab Results  ?Component Value Date  ? VD25OH 27.30 (L) 02/08/2021  ? ?Low, to start oral replacement ? ?

## 2022-02-21 NOTE — Patient Instructions (Addendum)
Please take OTC Vitamin D3 at 2000 units per day, indefinitely ? ?Please take all new medication as prescribed - the optivar eye drops for the eyes ? ?Please take all new medication as recommended - the OTC Nasacort instead of the flonase ? ?Please continue all other medications as before, and refills have been done if requested. ? ?Please have the pharmacy call with any other refills you may need. ? ?Please continue your efforts at being more active, low cholesterol diet, and weight control. ? ?You are otherwise up to date with prevention measures today. ? ?Please keep your appointments with your specialists as you may have planned ? ?Please go to the LAB at the blood drawing area for the tests to be done - at the [facility] lab at your convenience ? ?You will be contacted by phone if any changes need to be made immediately.  Otherwise, you will receive a letter about your results with an explanation, but please check with MyChart first. ? ?Please remember to sign up for MyChart if you have not done so, as this will be important to you in the future with finding out test results, communicating by private email, and scheduling acute appointments online when needed. ? ?Please make an Appointment to return for your 1 year visit, or sooner if needed, with Lab testing by Appointment as well, to be done about 3-5 days before at the National (so this is for TWO appointments - please see the scheduling desk as you leave) ? ?Due to the ongoing Covid 19 pandemic, our lab now requires an appointment for any labs done at our office.  If you need labs done and do not have an appointment, please call our office ahead of time to schedule before presenting to the lab for your testing. ? ? ? ? ?

## 2022-02-22 ENCOUNTER — Ambulatory Visit (AMBULATORY_SURGERY_CENTER): Payer: BC Managed Care – PPO | Admitting: *Deleted

## 2022-02-22 VITALS — Ht 65.0 in | Wt 175.0 lb

## 2022-02-22 DIAGNOSIS — Z1211 Encounter for screening for malignant neoplasm of colon: Secondary | ICD-10-CM

## 2022-02-22 MED ORDER — NA SULFATE-K SULFATE-MG SULF 17.5-3.13-1.6 GM/177ML PO SOLN: 2.0000 | Freq: Once | ORAL | 0 refills | Status: AC

## 2022-02-22 NOTE — Progress Notes (Signed)
No egg or soy allergy known to patient  No issues known to pt with past sedation with any surgeries or procedures Patient denies ever being told they had issues or difficulty with intubation  No FH of Malignant Hyperthermia Pt is not on diet pills Pt is not on  home 02  Pt is not on blood thinners  Pt denies issues with constipation  No A fib or A flutter   PV completed over the phone. Pt verified name, DOB, address and insurance during PV today.   Pt encouraged to call with questions or issues.  If pt has My chart, procedure instructions sent via My Chart  Insurance confirmed with pt at [facility] today

## 2022-02-24 ENCOUNTER — Encounter: Payer: Self-pay | Admitting: Internal Medicine

## 2022-02-24 DIAGNOSIS — H1010 Acute atopic conjunctivitis, unspecified eye: Secondary | ICD-10-CM | POA: Insufficient documentation

## 2022-02-24 NOTE — Assessment & Plan Note (Signed)
Lab Results  Component Value Date   HGBA1C 5.8 02/08/2021   Stable, pt to continue current medical treatment  - diet

## 2022-02-24 NOTE — Assessment & Plan Note (Signed)

## 2022-02-24 NOTE — Assessment & Plan Note (Signed)
Lab Results  Component Value Date   LDLCALC 130 (H) 02/08/2021   uncontrolled, goal ldl < 100, pt to continue current low chol diet, declines statin

## 2022-02-24 NOTE — Assessment & Plan Note (Signed)
Mild to mod seasonal flare, for nasacort asd,  to f/u any worsening symptoms or concerns

## 2022-02-24 NOTE — Assessment & Plan Note (Signed)
Mild to mod, for optivar asd,,  to f/u any worsening symptoms or concerns

## 2022-02-27 ENCOUNTER — Other Ambulatory Visit (INDEPENDENT_AMBULATORY_CARE_PROVIDER_SITE_OTHER): Payer: BC Managed Care – PPO

## 2022-02-27 DIAGNOSIS — E538 Deficiency of other specified B group vitamins: Secondary | ICD-10-CM

## 2022-02-27 DIAGNOSIS — Z125 Encounter for screening for malignant neoplasm of prostate: Secondary | ICD-10-CM | POA: Diagnosis not present

## 2022-02-27 DIAGNOSIS — E559 Vitamin D deficiency, unspecified: Secondary | ICD-10-CM | POA: Diagnosis not present

## 2022-02-27 DIAGNOSIS — E7800 Pure hypercholesterolemia, unspecified: Secondary | ICD-10-CM

## 2022-02-27 DIAGNOSIS — R739 Hyperglycemia, unspecified: Secondary | ICD-10-CM | POA: Diagnosis not present

## 2022-02-27 LAB — URINALYSIS, ROUTINE W REFLEX MICROSCOPIC
Bilirubin Urine: NEGATIVE
Hgb urine dipstick: NEGATIVE
Ketones, ur: NEGATIVE
Leukocytes,Ua: NEGATIVE
Nitrite: NEGATIVE
RBC / HPF: NONE SEEN (ref 0–?)
Specific Gravity, Urine: 1.015 (ref 1.000–1.030)
Urine Glucose: NEGATIVE
Urobilinogen, UA: 1 (ref 0.0–1.0)
WBC, UA: NONE SEEN (ref 0–?)
pH: 7.5 (ref 5.0–8.0)

## 2022-02-27 LAB — LIPID PANEL
Cholesterol: 166 mg/dL (ref 0–200)
HDL: 40.9 mg/dL (ref >39.00)
LDL Cholesterol: 114 mg/dL — ABNORMAL HIGH (ref 0–99)
NonHDL: 124.97
Total CHOL/HDL Ratio: 4
Triglycerides: 57 mg/dL (ref 0.0–149.0)
VLDL: 11.4 mg/dL (ref 0.0–40.0)

## 2022-02-27 LAB — HEPATIC FUNCTION PANEL
ALT: 34 U/L (ref 0–53)
AST: 25 U/L (ref 0–37)
Albumin: 4.2 g/dL (ref 3.5–5.2)
Alkaline Phosphatase: 52 U/L (ref 39–117)
Bilirubin, Direct: 0.1 mg/dL (ref 0.0–0.3)
Total Bilirubin: 0.6 mg/dL (ref 0.2–1.2)
Total Protein: 7 g/dL (ref 6.0–8.3)

## 2022-02-27 LAB — CBC WITH DIFFERENTIAL/PLATELET
Basophils Absolute: 0 10*3/uL (ref 0.0–0.1)
Basophils Relative: 0.8 % (ref 0.0–3.0)
Eosinophils Absolute: 0.1 10*3/uL (ref 0.0–0.7)
Eosinophils Relative: 2.4 % (ref 0.0–5.0)
HCT: 49.4 % (ref 39.0–52.0)
Hemoglobin: 16.3 g/dL (ref 13.0–17.0)
Lymphocytes Relative: 36.8 % (ref 12.0–46.0)
Lymphs Abs: 2.1 10*3/uL (ref 0.7–4.0)
MCHC: 32.9 g/dL (ref 30.0–36.0)
MCV: 84.8 fl (ref 78.0–100.0)
Monocytes Absolute: 0.5 10*3/uL (ref 0.1–1.0)
Monocytes Relative: 9.5 % (ref 3.0–12.0)
Neutro Abs: 2.9 10*3/uL (ref 1.4–7.7)
Neutrophils Relative %: 50.5 % (ref 43.0–77.0)
Platelets: 176 10*3/uL (ref 150.0–400.0)
RBC: 5.83 Mil/uL — ABNORMAL HIGH (ref 4.22–5.81)
RDW: 13.2 % (ref 11.5–15.5)
WBC: 5.7 10*3/uL (ref 4.0–10.5)

## 2022-02-27 LAB — BASIC METABOLIC PANEL
BUN: 16 mg/dL (ref 6–23)
CO2: 29 mEq/L (ref 19–32)
Calcium: 9.3 mg/dL (ref 8.4–10.5)
Chloride: 101 mEq/L (ref 96–112)
Creatinine, Ser: 1.4 mg/dL (ref 0.40–1.50)
GFR: 60.12 mL/min (ref >60.00)
Glucose, Bld: 97 mg/dL (ref 70–99)
Potassium: 4.2 mEq/L (ref 3.5–5.1)
Sodium: 137 mEq/L (ref 135–145)

## 2022-02-27 LAB — TSH: TSH: 1.54 u[IU]/mL (ref 0.35–5.50)

## 2022-02-27 LAB — PSA: PSA: 0.55 ng/mL (ref 0.10–4.00)

## 2022-02-27 LAB — VITAMIN D 25 HYDROXY (VIT D DEFICIENCY, FRACTURES): VITD: 26.86 ng/mL — ABNORMAL LOW (ref 30.00–100.00)

## 2022-02-27 LAB — HEMOGLOBIN A1C: Hgb A1c MFr Bld: 5.9 % (ref 4.6–6.5)

## 2022-02-27 LAB — VITAMIN B12: Vitamin B-12: 503 pg/mL (ref 211–911)

## 2022-03-19 ENCOUNTER — Encounter: Payer: Self-pay | Admitting: Gastroenterology

## 2022-03-22 ENCOUNTER — Encounter: Payer: BC Managed Care – PPO | Admitting: Gastroenterology

## 2022-03-23 ENCOUNTER — Ambulatory Visit (AMBULATORY_SURGERY_CENTER): Payer: BC Managed Care – PPO | Admitting: Gastroenterology

## 2022-03-23 ENCOUNTER — Encounter: Payer: Self-pay | Admitting: Gastroenterology

## 2022-03-23 VITALS — BP 127/69 | HR 60 | Temp 98.0°F | Resp 11 | Ht 65.0 in | Wt 175.0 lb

## 2022-03-23 DIAGNOSIS — D123 Benign neoplasm of transverse colon: Secondary | ICD-10-CM | POA: Diagnosis not present

## 2022-03-23 DIAGNOSIS — D120 Benign neoplasm of cecum: Secondary | ICD-10-CM

## 2022-03-23 DIAGNOSIS — Z1211 Encounter for screening for malignant neoplasm of colon: Secondary | ICD-10-CM

## 2022-03-23 DIAGNOSIS — D122 Benign neoplasm of ascending colon: Secondary | ICD-10-CM | POA: Diagnosis not present

## 2022-03-23 MED ORDER — SODIUM CHLORIDE 0.9 % IV SOLN: 500.0000 mL | Freq: Once | INTRAVENOUS | Status: DC

## 2022-03-23 NOTE — Progress Notes (Signed)
No problems noted in the recovery room. maw 

## 2022-03-23 NOTE — Progress Notes (Signed)
Called to room to assist during endoscopic procedure.  Patient ID and intended procedure confirmed with present staff. Received instructions for my participation in the procedure from the performing physician.  

## 2022-03-23 NOTE — Progress Notes (Signed)
History and Physical:  This patient presents for endoscopic testing for: Encounter Diagnosis  Name Primary?   Special screening for malignant neoplasms, colon Yes    Average risk, first screening exam. Patient denies chronic abdominal pain, rectal bleeding, constipation or diarrhea.   Patient is otherwise without complaints or active issues today.   Past Medical History: Past Medical History:  Diagnosis Date   Allergic conjunctivitis of both eyes 01/29/2012   Allergic rhinitis, cause unspecified    Allergy    Asthma    GERD (gastroesophageal reflux disease) 01/14/2019   HLD (hyperlipidemia) 01/14/2019     Past Surgical History: Past Surgical History:  Procedure Laterality Date   COLONOSCOPY      Allergies: Allergies  Allergen Reactions   Fish Allergy Itching    Top of mouth   Other Other (See Comments)    Grass causes seasonal allergies    Outpatient Meds: Current Outpatient Medications  Medication Sig Dispense Refill   azelastine (OPTIVAR) 0.05 % ophthalmic solution Place 1 drop into both eyes 2 (two) times daily. 6 mL 12   Cholecalciferol (THERA-D 2000) 50 MCG (2000 UT) TABS 1 tab by mouth once daily 30 tablet 99   fexofenadine (ALLEGRA) 180 MG tablet Take 1 tablet (180 mg total) by mouth daily. 30 tablet 11   ibuprofen (ADVIL,MOTRIN) 400 MG tablet Take 400 mg by mouth every 6 (six) hours as needed for pain.     triamcinolone (NASACORT) 55 MCG/ACT AERO nasal inhaler Place 2 sprays into the nose daily. 1 each 12   Current Facility-Administered Medications  Medication Dose Route Frequency Provider Last Rate Last Admin   0.9 %  sodium chloride infusion  500 mL Intravenous Once Danis, Estill Cotta III, MD          ___________________________________________________________________ Objective   Exam:  BP (!) 144/82   Pulse 60   Temp 98 F (36.7 C) (Temporal)   Ht '5\' 5"'$  (1.651 m)   Wt 175 lb (79.4 kg)   SpO2 98%   BMI 29.12 kg/m   CV: RRR without murmur,  S1/S2 Resp: clear to auscultation bilaterally, normal RR and effort noted GI: soft, no tenderness, with active bowel sounds.   Assessment: Encounter Diagnosis  Name Primary?   Special screening for malignant neoplasms, colon Yes     Plan: Colonoscopy  The benefits and risks of the planned procedure were described in detail with the patient or (when appropriate) their health care proxy.  Risks were outlined as including, but not limited to, bleeding, infection, perforation, adverse medication reaction leading to cardiac or pulmonary decompensation, pancreatitis (if ERCP).  The limitation of incomplete mucosal visualization was also discussed.  No guarantees or warranties were given.    The patient is appropriate for an endoscopic procedure in the ambulatory setting.   - Wilfrid Lund, MD

## 2022-03-23 NOTE — Patient Instructions (Signed)
Handout was given to your care partner on polyps. You may resume your current medications today. Await biopsy results.  May take 1-3 weeks to receive pathology results. Please call if any questions or concerns.   YOU HAD AN ENDOSCOPIC PROCEDURE TODAY AT [facility]:   Refer to the procedure report that was given to you for any specific questions about what was found during the examination.  If the procedure report does not answer your questions, please call your gastroenterologist to clarify.  If you requested that your care partner not be given the details of your procedure findings, then the procedure report has been included in a sealed envelope for you to review at your convenience later.  YOU SHOULD EXPECT: Some feelings of bloating in the abdomen. Passage of more gas than usual.  Walking can help get rid of the air that was put into your GI tract during the procedure and reduce the bloating. If you had a lower endoscopy (such as a colonoscopy or flexible sigmoidoscopy) you may notice spotting of blood in your stool or on the toilet paper. If you underwent a bowel prep for your procedure, you may not have a normal bowel movement for a few days.  Please Note:  You might notice some irritation and congestion in your nose or some drainage.  This is from the oxygen used during your procedure.  There is no need for concern and it should clear up in a day or so.  SYMPTOMS TO REPORT IMMEDIATELY:  Following lower endoscopy (colonoscopy or flexible sigmoidoscopy):  Excessive amounts of blood in the stool  Significant tenderness or worsening of abdominal pains  Swelling of the abdomen that is new, acute  Fever of 100F or higher    For urgent or emergent issues, a gastroenterologist can be reached at any hour by calling [phone]. Do not use MyChart messaging for urgent concerns.    DIET:  We do recommend a small meal at first, but then you may proceed to your regular diet.   Drink plenty of fluids but you should avoid alcoholic beverages for 24 hours.  ACTIVITY:  You should plan to take it easy for the rest of today and you should NOT DRIVE or use heavy machinery until tomorrow (because of the sedation medicines used during the test).    FOLLOW UP: Our staff will call the number listed on your records 24-72 hours following your procedure to check on you and address any questions or concerns that you may have regarding the information given to you following your procedure. If we do not reach you, we will leave a message.  We will attempt to reach you two times.  During this call, we will ask if you have developed any symptoms of COVID 19. If you develop any symptoms (ie: fever, flu-like symptoms, shortness of breath, cough etc.) before then, please call [phone].  If you test positive for Covid 19 in the 2 weeks post procedure, please call and report this information to [place].    If any biopsies were taken you will be contacted by phone or by letter within the next 1-3 weeks.  Please call us at [phone] if you have not heard about the biopsies in 3 weeks.    SIGNATURES/CONFIDENTIALITY: You and/or your care partner have signed paperwork which will be entered into your electronic medical record.  These signatures attest to the fact that that the information above on your After Visit Summary has been reviewed  and is understood.  Full responsibility of the confidentiality of this discharge information lies with you and/or your care-partner.

## 2022-03-23 NOTE — Progress Notes (Signed)
Pt's states no medical or surgical changes since previsit or office visit. 

## 2022-03-23 NOTE — Progress Notes (Signed)
Vss nad trans to pacu °

## 2022-03-23 NOTE — Op Note (Signed)
[facility] Patient Name: Andrew Bailey Procedure Date: 03/23/2022 11:32 AM MRN: [id number] Endoscopist: [place]. Loletha Carrow , MD Age: 46 Referring MD:  Date of Birth: Sep 22, 1976 Gender: Male Account #: [id number] Procedure:                Colonoscopy Indications:              Screening for colorectal malignant neoplasm, This                            is the patient's first colonoscopy Medicines:                Monitored Anesthesia Care Procedure:                Pre-Anesthesia Assessment:                           - Prior to the procedure, a History and Physical                            was performed, and patient medications and                            allergies were reviewed. The patient's tolerance of                            previous anesthesia was also reviewed. The risks                            and benefits of the procedure and the sedation                            options and risks were discussed with the patient.                            All questions were answered, and informed consent                            was obtained. Prior Anticoagulants: The patient has                            taken no previous anticoagulant or antiplatelet                            agents. ASA Grade Assessment: II - A patient with                            mild systemic disease. After reviewing the risks                            and benefits, the patient was deemed in                            satisfactory condition to undergo the procedure.  After obtaining informed consent, the colonoscope                            was passed under direct vision. Throughout the                            procedure, the patient's blood pressure, pulse, and                            oxygen saturations were monitored continuously. The                            CF [id number] #[id number] was introduced through the anus                            and advanced to the the  cecum, identified by                            appendiceal orifice and ileocecal valve. The                            colonoscopy was performed without difficulty. The                            patient tolerated the procedure well. The quality                            of the bowel preparation was excellent. The                            ileocecal valve, appendiceal orifice, and rectum                            were photographed. Scope In: 11:36:12 AM Scope Out: 11:48:16 AM Scope Withdrawal Time: 0 hours 11 minutes 6 seconds  Total Procedure Duration: 0 hours 12 minutes 4 seconds  Findings:                 The perianal and digital rectal examinations were                            normal.                           Repeat examination of right colon under NBI                            performed.                           Three sessile and semi-pedunculated polyps were                            found in the proximal transverse colon, ascending  colon and cecum. The polyps were diminutive in                            size. These polyps were removed with a cold snare.                            Resection and retrieval were complete.                           The exam was otherwise without abnormality on                            direct and retroflexion views. Complications:            No immediate complications. Estimated Blood Loss:     Estimated blood loss was minimal. Impression:               - Three diminutive polyps in the proximal                            transverse colon, in the ascending colon and in the                            cecum, removed with a cold snare. Resected and                            retrieved.                           - The examination was otherwise normal on direct                            and retroflexion views. Recommendation:           - Patient has a contact number available for                             emergencies. The signs and symptoms of potential                            delayed complications were discussed with the                            patient. Return to normal activities tomorrow.                            Written discharge instructions were provided to the                            patient.                           - Resume previous diet.                           - Continue present medications.                           -  Await pathology results.                           - Repeat colonoscopy is recommended for                            surveillance. The colonoscopy date will be                            determined after pathology results from today's                            exam become available for review. Andrew Mcpartland L. Loletha Carrow, MD 03/23/2022 11:51:12 AM This report has been signed electronically.

## 2022-03-27 ENCOUNTER — Encounter: Payer: Self-pay | Admitting: Gastroenterology

## 2022-08-08 ENCOUNTER — Other Ambulatory Visit: Payer: Self-pay | Admitting: Internal Medicine

## 2022-08-08 DIAGNOSIS — E7800 Pure hypercholesterolemia, unspecified: Secondary | ICD-10-CM

## 2022-08-08 DIAGNOSIS — R739 Hyperglycemia, unspecified: Secondary | ICD-10-CM

## 2022-08-08 DIAGNOSIS — E559 Vitamin D deficiency, unspecified: Secondary | ICD-10-CM

## 2022-08-08 DIAGNOSIS — E538 Deficiency of other specified B group vitamins: Secondary | ICD-10-CM

## 2022-08-08 DIAGNOSIS — Z125 Encounter for screening for malignant neoplasm of prostate: Secondary | ICD-10-CM

## 2023-02-26 ENCOUNTER — Encounter: Payer: BC Managed Care – PPO | Admitting: Internal Medicine

## 2023-03-06 ENCOUNTER — Ambulatory Visit (INDEPENDENT_AMBULATORY_CARE_PROVIDER_SITE_OTHER): Payer: Self-pay | Admitting: Internal Medicine

## 2023-03-06 ENCOUNTER — Encounter: Payer: Self-pay | Admitting: Internal Medicine

## 2023-03-06 VITALS — BP 124/76 | HR 55 | Temp 97.9°F | Ht 65.0 in | Wt 174.0 lb

## 2023-03-06 DIAGNOSIS — R739 Hyperglycemia, unspecified: Secondary | ICD-10-CM

## 2023-03-06 DIAGNOSIS — H6991 Unspecified Eustachian tube disorder, right ear: Secondary | ICD-10-CM

## 2023-03-06 DIAGNOSIS — Z125 Encounter for screening for malignant neoplasm of prostate: Secondary | ICD-10-CM

## 2023-03-06 DIAGNOSIS — E559 Vitamin D deficiency, unspecified: Secondary | ICD-10-CM

## 2023-03-06 DIAGNOSIS — Z0001 Encounter for general adult medical examination with abnormal findings: Secondary | ICD-10-CM

## 2023-03-06 DIAGNOSIS — E7800 Pure hypercholesterolemia, unspecified: Secondary | ICD-10-CM

## 2023-03-06 LAB — CBC WITH DIFFERENTIAL/PLATELET
Basophils Absolute: 0 10*3/uL (ref 0.0–0.1)
Basophils Relative: 0.6 % (ref 0.0–3.0)
Eosinophils Absolute: 0.2 10*3/uL (ref 0.0–0.7)
Eosinophils Relative: 2.5 % (ref 0.0–5.0)
HCT: 50.7 % (ref 39.0–52.0)
Hemoglobin: 16.4 g/dL (ref 13.0–17.0)
Lymphocytes Relative: 29.9 % (ref 12.0–46.0)
Lymphs Abs: 2.4 10*3/uL (ref 0.7–4.0)
MCHC: 32.3 g/dL (ref 30.0–36.0)
MCV: 85.7 fl (ref 78.0–100.0)
Monocytes Absolute: 0.7 10*3/uL (ref 0.1–1.0)
Monocytes Relative: 8.7 % (ref 3.0–12.0)
Neutro Abs: 4.8 10*3/uL (ref 1.4–7.7)
Neutrophils Relative %: 58.3 % (ref 43.0–77.0)
Platelets: 200 10*3/uL (ref 150.0–400.0)
RBC: 5.91 Mil/uL — ABNORMAL HIGH (ref 4.22–5.81)
RDW: 13.2 % (ref 11.5–15.5)
WBC: 8.2 10*3/uL (ref 4.0–10.5)

## 2023-03-06 LAB — HEPATIC FUNCTION PANEL
ALT: 35 U/L (ref 0–53)
AST: 25 U/L (ref 0–37)
Albumin: 4.3 g/dL (ref 3.5–5.2)
Alkaline Phosphatase: 55 U/L (ref 39–117)
Bilirubin, Direct: 0.1 mg/dL (ref 0.0–0.3)
Total Bilirubin: 0.8 mg/dL (ref 0.2–1.2)
Total Protein: 7.4 g/dL (ref 6.0–8.3)

## 2023-03-06 LAB — LIPID PANEL
Cholesterol: 205 mg/dL — ABNORMAL HIGH (ref 0–200)
HDL: 49.6 mg/dL (ref >39.00)
LDL Cholesterol: 147 mg/dL — ABNORMAL HIGH (ref 0–99)
NonHDL: 155.26
Total CHOL/HDL Ratio: 4
Triglycerides: 40 mg/dL (ref 0.0–149.0)
VLDL: 8 mg/dL (ref 0.0–40.0)

## 2023-03-06 LAB — BASIC METABOLIC PANEL
BUN: 15 mg/dL (ref 6–23)
CO2: 28 mEq/L (ref 19–32)
Calcium: 9.3 mg/dL (ref 8.4–10.5)
Chloride: 101 mEq/L (ref 96–112)
Creatinine, Ser: 1.16 mg/dL (ref 0.40–1.50)
GFR: 74.81 mL/min (ref >60.00)
Glucose, Bld: 72 mg/dL (ref 70–99)
Potassium: 4.3 mEq/L (ref 3.5–5.1)
Sodium: 136 mEq/L (ref 135–145)

## 2023-03-06 LAB — TSH: TSH: 1.94 u[IU]/mL (ref 0.35–5.50)

## 2023-03-06 LAB — PSA: PSA: 0.5 ng/mL (ref 0.10–4.00)

## 2023-03-06 NOTE — Patient Instructions (Signed)
Please continue all other medications as before, and refills have been done if requested.  Please have the pharmacy call with any other refills you may need.  Please continue your efforts at being more active, low cholesterol diet, and weight control.  You are otherwise up to date with prevention measures today.  Please keep your appointments with your specialists as you may have planned  Please make an Appointment to return for your 1 year visit, or sooner if needed 

## 2023-03-06 NOTE — Assessment & Plan Note (Signed)
Lab Results  Component Value Date   HGBA1C 5.9 02/27/2022   Stable, pt to continue current medical treatment  - diet, wt control

## 2023-03-06 NOTE — Progress Notes (Signed)
Patient ID: Andrew Bailey, male   DOB: 1975-12-26, 47 y.o.   MRN: [id number]         Chief Complaint:: wellness exam and Annual Exam (Concerns about feeling sore , been having more wax in right ear then normal)         HPI:  Andrew Bailey is a 47 y.o. male here for wellness exam; up to date                Also Pt denies chest pain, increased sob or doe, wheezing, orthopnea, PND, increased LE swelling, palpitations, dizziness or syncope.   Pt denies polydipsia, polyuria, or new focal neuro s/s.    Pt denies fever, wt loss, night sweats, loss of appetite, or other constitutional symptoms     Wt Readings from Last 3 Encounters:  03/06/23 174 lb (78.9 kg)  03/23/22 175 lb (79.4 kg)  02/22/22 175 lb (79.4 kg)   BP Readings from Last 3 Encounters:  03/06/23 124/76  03/23/22 127/69  02/21/22 138/80   Immunization History  Administered Date(s) Administered   Influenza,inj,Quad PF,6+ Mos 10/07/2013, 12/08/2014   Influenza-Unspecified 10/07/2013, 12/08/2014   PFIZER(Purple Top)SARS-COV-2 Vaccination 12/19/2019, 01/09/2020, 07/18/2020   Tdap 10/07/2013, 03/28/2019  There are no preventive care reminders to display for this patient.    Past Medical History:  Diagnosis Date   Allergic conjunctivitis of both eyes 01/29/2012   Allergic rhinitis, cause unspecified    Allergy    Asthma    GERD (gastroesophageal reflux disease) 01/14/2019   HLD (hyperlipidemia) 01/14/2019   Past Surgical History:  Procedure Laterality Date   COLONOSCOPY      reports that he has never smoked. He has never used smokeless tobacco. He reports that he does not drink alcohol and does not use drugs. family history includes Cancer in an other family member; Diabetes in his father, mother, and other family members; Heart disease in his father and another family member; Hypertension in his sister. Allergies  Allergen Reactions   Fish Allergy Itching    Top of mouth   Other Other (See Comments)    Grass causes  seasonal allergies   Current Outpatient Medications on File Prior to Visit  Medication Sig Dispense Refill   fluticasone (FLONASE) 50 MCG/ACT nasal spray 1 spray Once Daily.     azelastine (OPTIVAR) 0.05 % ophthalmic solution Place 1 drop into both eyes 2 (two) times daily. (Patient not taking: Reported on 03/06/2023) 6 mL 12   Cholecalciferol (THERA-D 2000) 50 MCG (2000 UT) TABS 1 tab by mouth once daily (Patient not taking: Reported on 03/06/2023) 30 tablet 99   ibuprofen (ADVIL,MOTRIN) 400 MG tablet Take 400 mg by mouth every 6 (six) hours as needed for pain. (Patient not taking: Reported on 03/06/2023)     triamcinolone (NASACORT) 55 MCG/ACT AERO nasal inhaler Place 2 sprays into the nose daily. (Patient not taking: Reported on 03/06/2023) 1 each 12   No current facility-administered medications on file prior to visit.        ROS:  All others reviewed and negative.  Objective        PE:  BP 124/76 (BP Location: Left Arm, Patient Position: Sitting, Cuff Size: Normal)   Pulse (!) 55   Temp 97.9 F (36.6 C) (Oral)   Ht 5\' 5"  (1.651 m)   Wt 174 lb (78.9 kg)   SpO2 98%   BMI 28.96 kg/m  Constitutional: Pt appears in NAD               HENT: Head: NCAT.                Right Ear: External ear normal.                 Left Ear: External ear normal.                Eyes: . Pupils are equal, round, and reactive to light. Conjunctivae and EOM are normal               Nose: without d/c or deformity               Neck: Neck supple. Gross normal ROM               Cardiovascular: Normal rate and regular rhythm.                 Pulmonary/Chest: Effort normal and breath sounds without rales or wheezing.                Abd:  Soft, NT, ND, + BS, no organomegaly               Neurological: Pt is alert. At baseline orientation, motor grossly intact               Skin: Skin is warm. No rashes, no other new lesions, LE edema - none               Psychiatric: Pt behavior is normal without  agitation   Micro: none  Cardiac tracings I have personally interpreted today:  none  Pertinent Radiological findings (summarize): none   Lab Results  Component Value Date   WBC 5.7 02/27/2022   HGB 16.3 02/27/2022   HCT 49.4 02/27/2022   PLT 176.0 02/27/2022   GLUCOSE 97 02/27/2022   CHOL 166 02/27/2022   TRIG 57.0 02/27/2022   HDL 40.90 02/27/2022   LDLCALC 114 (H) 02/27/2022   ALT 34 02/27/2022   AST 25 02/27/2022   NA 137 02/27/2022   K 4.2 02/27/2022   CL 101 02/27/2022   CREATININE 1.40 02/27/2022   BUN 16 02/27/2022   CO2 29 02/27/2022   TSH 1.54 02/27/2022   PSA 0.55 02/27/2022   HGBA1C 5.9 02/27/2022   Assessment/Plan:  Andrew Bailey is a 47 y.o. Black or African American [2] male with  has a past medical history of Allergic conjunctivitis of both eyes (01/29/2012), Allergic rhinitis, cause unspecified, Allergy, Asthma, GERD (gastroesophageal reflux disease) (01/14/2019), and HLD (hyperlipidemia) (01/14/2019).  Encounter for well adult exam with abnormal findings Age and sex appropriate education and counseling updated with regular exercise and diet Referrals for preventative services - none needed Immunizations addressed - none needed Smoking counseling  - none needed Evidence for depression or other mood disorder - none significant Most recent labs reviewed. I have personally reviewed and have noted: 1) the patient's medical and social history 2) The patient's current medications and supplements 3) The patient's height, weight, and BMI have been recorded in the chart   HLD (hyperlipidemia) Lab Results  Component Value Date   LDLCALC 114 (H) 02/27/2022   Uncontrolled, goal < 100,, pt for lower chol diet, declines statin for now   Hyperglycemia Lab Results  Component Value Date   HGBA1C 5.9 02/27/2022   Stable, pt to continue current medical treatment  - diet, wt control  Vitamin D deficiency Last vitamin D Lab Results  Component Value Date    VD25OH 26.86 (L) 02/27/2022   Low, to start oral replacement   Acute dysfunction of right eustachian tube No wax evident - for mucinex bid prn  Followup: Return in about 1 year (around 03/05/2024).  Oliver Barre, MD 03/06/2023 8:50 PM [facility]

## 2023-03-06 NOTE — Assessment & Plan Note (Signed)

## 2023-03-06 NOTE — Assessment & Plan Note (Signed)
No wax evident - for mucinex bid prn

## 2023-03-06 NOTE — Assessment & Plan Note (Signed)
Lab Results  Component Value Date   LDLCALC 114 (H) 02/27/2022   Uncontrolled, goal < 100,, pt for lower chol diet, declines statin for now

## 2023-03-06 NOTE — Assessment & Plan Note (Signed)
Last vitamin D Lab Results  Component Value Date   VD25OH 26.86 (L) 02/27/2022   Low, to start oral replacement

## 2023-03-07 LAB — URINALYSIS, ROUTINE W REFLEX MICROSCOPIC
Bilirubin Urine: NEGATIVE
Hgb urine dipstick: NEGATIVE
Ketones, ur: NEGATIVE
Leukocytes,Ua: NEGATIVE
Nitrite: NEGATIVE
Specific Gravity, Urine: 1.02 (ref 1.000–1.030)
Total Protein, Urine: NEGATIVE
Urine Glucose: NEGATIVE
Urobilinogen, UA: 0.2 (ref 0.0–1.0)
pH: 7.5 (ref 5.0–8.0)

## 2023-03-07 LAB — HEMOGLOBIN A1C: Hgb A1c MFr Bld: 6.1 % (ref 4.6–6.5)

## 2023-10-24 ENCOUNTER — Telehealth: Payer: Self-pay | Admitting: Internal Medicine

## 2023-10-24 NOTE — Telephone Encounter (Signed)
Without being able to see the spot he refers to, this sounds like "psuedofolliculitis barbae" which is a bump that occurs when a hair grows into the skin usually after shaving too close  This is not serious, and is not an infection, and usually responds to not shaving so closely, and given time to heal on its own,   If it is large however, perhaps seeing dermatology would help, and I can refer.   But seeing general surgury for this type of problem (if it is what I suspect) than a general surgeon would not be helpful

## 2023-10-24 NOTE — Telephone Encounter (Signed)
Copied from CRM [id number]. Topic: Referral - Request for Referral >> Oct 24, 2023  1:13 PM Andrew Bailey wrote: Did the patient discuss referral with their provider in the last year? No (If No - schedule appointment) (If Yes - send message)  Appointment offered? No  Type of order/referral and detailed reason for visit: Pt stated that he had an ingrown hair on the back of his neck about two years ago and has surgery , he stated that he has a knot now in the same spot and his skin is raised off the skin  Preference of office, provider, location: Not able to provide at the moment, possibly [facility]   If referral order, have you been seen by this specialty before? Yes (If Yes, this issue or another issue? When? Where? Pt stated that he had an ingrown hair on the back of his neck about two years ago   Can we respond through MyChart? Yes

## 2023-10-25 NOTE — Telephone Encounter (Signed)
Ok we would need a ROV as they wont accept a referral without his being seen in the office first

## 2023-10-28 NOTE — Telephone Encounter (Signed)
Called and let Pt know states he would like to do that with his scheduled physical on 11/13/23.

## 2023-11-13 ENCOUNTER — Encounter: Payer: 59 | Admitting: Internal Medicine

## 2023-11-25 ENCOUNTER — Encounter: Payer: Self-pay | Admitting: Internal Medicine

## 2023-11-25 ENCOUNTER — Ambulatory Visit (INDEPENDENT_AMBULATORY_CARE_PROVIDER_SITE_OTHER): Payer: 59 | Admitting: Internal Medicine

## 2023-11-25 VITALS — BP 124/82 | HR 65 | Temp 97.9°F | Ht 65.0 in | Wt 179.0 lb

## 2023-11-25 DIAGNOSIS — R221 Localized swelling, mass and lump, neck: Secondary | ICD-10-CM | POA: Insufficient documentation

## 2023-11-25 DIAGNOSIS — R002 Palpitations: Secondary | ICD-10-CM | POA: Diagnosis not present

## 2023-11-25 DIAGNOSIS — N3943 Post-void dribbling: Secondary | ICD-10-CM

## 2023-11-25 DIAGNOSIS — E538 Deficiency of other specified B group vitamins: Secondary | ICD-10-CM

## 2023-11-25 DIAGNOSIS — Z0000 Encounter for general adult medical examination without abnormal findings: Secondary | ICD-10-CM | POA: Diagnosis not present

## 2023-11-25 DIAGNOSIS — R21 Rash and other nonspecific skin eruption: Secondary | ICD-10-CM

## 2023-11-25 DIAGNOSIS — E7800 Pure hypercholesterolemia, unspecified: Secondary | ICD-10-CM

## 2023-11-25 DIAGNOSIS — L84 Corns and callosities: Secondary | ICD-10-CM | POA: Insufficient documentation

## 2023-11-25 DIAGNOSIS — Z23 Encounter for immunization: Secondary | ICD-10-CM

## 2023-11-25 DIAGNOSIS — E559 Vitamin D deficiency, unspecified: Secondary | ICD-10-CM | POA: Diagnosis not present

## 2023-11-25 DIAGNOSIS — R739 Hyperglycemia, unspecified: Secondary | ICD-10-CM

## 2023-11-25 DIAGNOSIS — Z125 Encounter for screening for malignant neoplasm of prostate: Secondary | ICD-10-CM | POA: Diagnosis not present

## 2023-11-25 DIAGNOSIS — Z0001 Encounter for general adult medical examination with abnormal findings: Secondary | ICD-10-CM

## 2023-11-25 LAB — CBC WITH DIFFERENTIAL/PLATELET
Basophils Absolute: 0.1 10*3/uL (ref 0.0–0.1)
Basophils Relative: 0.9 % (ref 0.0–3.0)
Eosinophils Absolute: 0.2 10*3/uL (ref 0.0–0.7)
Eosinophils Relative: 2.8 % (ref 0.0–5.0)
HCT: 50.4 % (ref 39.0–52.0)
Hemoglobin: 16.5 g/dL (ref 13.0–17.0)
Lymphocytes Relative: 37.4 % (ref 12.0–46.0)
Lymphs Abs: 2.4 10*3/uL (ref 0.7–4.0)
MCHC: 32.7 g/dL (ref 30.0–36.0)
MCV: 86.3 fL (ref 78.0–100.0)
Monocytes Absolute: 0.7 10*3/uL (ref 0.1–1.0)
Monocytes Relative: 11.1 % (ref 3.0–12.0)
Neutro Abs: 3 10*3/uL (ref 1.4–7.7)
Neutrophils Relative %: 47.8 % (ref 43.0–77.0)
Platelets: 183 10*3/uL (ref 150.0–400.0)
RBC: 5.84 Mil/uL — ABNORMAL HIGH (ref 4.22–5.81)
RDW: 13 % (ref 11.5–15.5)
WBC: 6.3 10*3/uL (ref 4.0–10.5)

## 2023-11-25 LAB — LIPID PANEL
Cholesterol: 201 mg/dL — ABNORMAL HIGH (ref 0–200)
HDL: 46.7 mg/dL (ref >39.00)
LDL Cholesterol: 144 mg/dL — ABNORMAL HIGH (ref 0–99)
NonHDL: 153.91
Total CHOL/HDL Ratio: 4
Triglycerides: 52 mg/dL (ref 0.0–149.0)
VLDL: 10.4 mg/dL (ref 0.0–40.0)

## 2023-11-25 LAB — TSH: TSH: 1.24 u[IU]/mL (ref 0.35–5.50)

## 2023-11-25 LAB — BASIC METABOLIC PANEL
BUN: 17 mg/dL (ref 6–23)
CO2: 32 meq/L (ref 19–32)
Calcium: 9.2 mg/dL (ref 8.4–10.5)
Chloride: 102 meq/L (ref 96–112)
Creatinine, Ser: 1.18 mg/dL (ref 0.40–1.50)
GFR: 72.92 mL/min (ref >60.00)
Glucose, Bld: 69 mg/dL — ABNORMAL LOW (ref 70–99)
Potassium: 4.4 meq/L (ref 3.5–5.1)
Sodium: 140 meq/L (ref 135–145)

## 2023-11-25 LAB — HEPATIC FUNCTION PANEL
ALT: 29 U/L (ref 0–53)
AST: 23 U/L (ref 0–37)
Albumin: 4.3 g/dL (ref 3.5–5.2)
Alkaline Phosphatase: 57 U/L (ref 39–117)
Bilirubin, Direct: 0.1 mg/dL (ref 0.0–0.3)
Total Bilirubin: 0.6 mg/dL (ref 0.2–1.2)
Total Protein: 7 g/dL (ref 6.0–8.3)

## 2023-11-25 LAB — MICROALBUMIN / CREATININE URINE RATIO
Creatinine,U: 167.7 mg/dL
Microalb Creat Ratio: 4.7 mg/g (ref 0.0–30.0)
Microalb, Ur: 0.8 mg/dL (ref 0.0–1.9)

## 2023-11-25 LAB — HEMOGLOBIN A1C: Hgb A1c MFr Bld: 6.2 % (ref 4.6–6.5)

## 2023-11-25 LAB — PSA: PSA: 0.69 ng/mL (ref 0.10–4.00)

## 2023-11-25 LAB — VITAMIN B12: Vitamin B-12: 577 pg/mL (ref 211–911)

## 2023-11-25 MED ORDER — TRIAMCINOLONE ACETONIDE 0.1 % EX CREA: 1.0000 | TOPICAL_CREAM | Freq: Two times a day (BID) | CUTANEOUS | 2 refills | Status: AC

## 2023-11-25 NOTE — Assessment & Plan Note (Signed)
Etiology unclear, ecg reviewed, likeley Pvc or PAC, declines BB trial low dose

## 2023-11-25 NOTE — Assessment & Plan Note (Signed)
Lab Results  Component Value Date   LDLCALC 144 (H) 11/25/2023   Uncontrolled, declines statin for now, for lower chol diet

## 2023-11-25 NOTE — Patient Instructions (Signed)
Please take all new medication as prescribed -the cream  Your EKG was done today  Please continue all other medications as before, and refills have been done if requested.  Please have the pharmacy call with any other refills you may need.  Please continue your efforts at being more active, low cholesterol diet, and weight control.  You are otherwise up to date with prevention measures today.  Please keep your appointments with your specialists as you may have planned  You will be contacted regarding the referral for: podiatry (foot doctor), urology (for prostate), and General surgury  Please go to the LAB at the blood drawing area for the tests to be done  You will be contacted by phone if any changes need to be made immediately.  Otherwise, you will receive a letter about your results with an explanation, but please check with MyChart first.  Please make an Appointment to return for your 1 year visit, or sooner if needed

## 2023-11-25 NOTE — Assessment & Plan Note (Signed)
Right plantar foot with pain - for podiatry referral

## 2023-11-25 NOTE — Assessment & Plan Note (Addendum)
Age and sex appropriate education and counseling updated with regular exercise and diet Referrals for preventative services - none needed Immunizations addressed - declines prevnar, for flu shot today Smoking counseling  - none needed Evidence for depression or other mood disorder - none significant Most recent labs reviewed. I have personally reviewed and have noted: 1) the patient's medical and social history 2) The patient's current medications and supplements 3) The patient's height, weight, and BMI have been recorded in the chart

## 2023-11-25 NOTE — Assessment & Plan Note (Signed)
Last vitamin D Lab Results  Component Value Date   VD25OH 26.86 (L) 02/27/2022   Low, to start oral replacement

## 2023-11-25 NOTE — Assessment & Plan Note (Signed)
Lab Results  Component Value Date   HGBA1C 6.2 11/25/2023   Stable, pt to continue current medical treatment - diet, wt control

## 2023-11-25 NOTE — Assessment & Plan Note (Signed)
Left post mid neck, nodule like firm and mobile, pt states enlarging, wants removed, for general surgury referral

## 2023-11-25 NOTE — Assessment & Plan Note (Signed)
Etiology unclear, for UA with labs, also refer urology

## 2023-11-25 NOTE — Assessment & Plan Note (Signed)
Buttock eczema like - for triam cr prn asd

## 2023-11-25 NOTE — Progress Notes (Signed)
Patient ID: Andrew Bailey, male   DOB: [birth year], 48 y.o.   MRN: [id number]         Chief Complaint:: wellness exam and right foot corn, left post neck sub mass, hld, rash to buttocks, palpitations, dribbling post urination, low vit d       HPI:  Andrew Bailey is a 48 y.o. male here for wellness exam; declines prevnar 20, o/w up to date                        Also has worsening left post neck subcutaneous mass nodule like mobile but firm without cystic drainage or overlying skin change Also has a painful right foot corn for several month.  Also has itchy rash to buttocks returning after an episode several yrs ago tx succesfully with topical steroid.  Has been working on lower chol diet.  Denies urinary symptoms such as dysuria, frequency, urgency, flank pain, hematuria or n/v, fever, chills, but has mild increased intermittent post void dribbling.  Pt denies chest pain, increased sob or doe, wheezing, orthopnea, PND, increased LE swelling, dizziness or syncope, except does have mild intermittent palpitations.    Wt Readings from Last 3 Encounters:  11/25/23 179 lb (81.2 kg)  03/06/23 174 lb (78.9 kg)  03/23/22 175 lb (79.4 kg)   BP Readings from Last 3 Encounters:  11/25/23 124/82  03/06/23 124/76  03/23/22 127/69   Immunization History  Administered Date(s) Administered   Influenza, Seasonal, Injecte, Preservative Fre 11/25/2023   Influenza,inj,Quad PF,6+ Mos 10/07/2013, 12/08/2014   Influenza-Unspecified 10/07/2013, 12/08/2014   PFIZER(Purple Top)SARS-COV-2 Vaccination 12/19/2019, 01/09/2020, 07/18/2020   Tdap 10/07/2013, 03/28/2019   Health Maintenance Due  Topic Date Due   Pneumococcal Vaccine 70-61 Years old (1 of 2 - PCV) Never done      Past Medical History:  Diagnosis Date   Allergic conjunctivitis of both eyes 01/29/2012   Allergic rhinitis, cause unspecified    Allergy    Asthma    GERD (gastroesophageal reflux disease) 01/14/2019   HLD (hyperlipidemia) 01/14/2019    Past Surgical History:  Procedure Laterality Date   COLONOSCOPY      reports that he has never smoked. He has never used smokeless tobacco. He reports that he does not drink alcohol and does not use drugs. family history includes Cancer in an other family member; Diabetes in his father, mother, and other family members; Heart disease in his father and another family member; Hypertension in his sister. Allergies  Allergen Reactions   Fish Allergy Itching    Top of mouth   Other Other (See Comments)    Grass causes seasonal allergies   Current Outpatient Medications on File Prior to Visit  Medication Sig Dispense Refill   azelastine (OPTIVAR) 0.05 % ophthalmic solution Place 1 drop into both eyes 2 (two) times daily. (Patient not taking: Reported on 11/25/2023) 6 mL 12   Cholecalciferol (THERA-D 2000) 50 MCG (2000 UT) TABS 1 tab by mouth once daily (Patient not taking: Reported on 11/25/2023) 30 tablet 99   fluticasone (FLONASE) 50 MCG/ACT nasal spray 1 spray Once Daily. (Patient not taking: Reported on 11/25/2023)     ibuprofen (ADVIL,MOTRIN) 400 MG tablet Take 400 mg by mouth every 6 (six) hours as needed for pain. (Patient not taking: Reported on 11/25/2023)     triamcinolone (NASACORT) 55 MCG/ACT AERO nasal inhaler Place 2 sprays into the nose daily. (Patient not taking: Reported on 11/25/2023) 1 each 12  No current facility-administered medications on file prior to visit.        ROS:  All others reviewed and negative.  Objective        PE:  BP 124/82 (BP Location: Right Arm, Patient Position: Sitting, Cuff Size: Normal)   Pulse 65   Temp 97.9 F (36.6 C) (Oral)   Ht 5\' 5"  (1.651 m)   Wt 179 lb (81.2 kg)   SpO2 98%   BMI 29.79 kg/m                 Constitutional: Pt appears in NAD               HENT: Head: NCAT.                Right Ear: External ear normal.                 Left Ear: External ear normal.                Eyes: . Pupils are equal, round, and reactive to light.  Conjunctivae and EOM are normal               Nose: without d/c or deformity               Neck: Neck supple. Gross normal ROM               Cardiovascular: Normal rate and regular rhythm.                 Pulmonary/Chest: Effort normal and breath sounds without rales or wheezing.                Abd:  Soft, NT, ND, + BS, no organomegaly               Neurological: Pt is alert. At baseline orientation, motor grossly intact               Skin: Skin is warm. No rashes, no other new lesions, LE edema - none               Psychiatric: Pt behavior is normal without agitation   Micro: none  Cardiac tracings I have personally interpreted today:  ECG - sinus brady 58  Pertinent Radiological findings (summarize): none   Lab Results  Component Value Date   WBC 6.3 11/25/2023   HGB 16.5 11/25/2023   HCT 50.4 11/25/2023   PLT 183.0 11/25/2023   GLUCOSE 69 (L) 11/25/2023   CHOL 201 (H) 11/25/2023   TRIG 52.0 11/25/2023   HDL 46.70 11/25/2023   LDLCALC 144 (H) 11/25/2023   ALT 29 11/25/2023   AST 23 11/25/2023   NA 140 11/25/2023   K 4.4 11/25/2023   CL 102 11/25/2023   CREATININE 1.18 11/25/2023   BUN 17 11/25/2023   CO2 32 11/25/2023   TSH 1.24 11/25/2023   PSA 0.69 11/25/2023   HGBA1C 6.2 11/25/2023   MICROALBUR 0.8 11/25/2023   Assessment/Plan:  Andrew Bailey is a 48 y.o. Black or African American [2] male with  has a past medical history of Allergic conjunctivitis of both eyes (01/29/2012), Allergic rhinitis, cause unspecified, Allergy, Asthma, GERD (gastroesophageal reflux disease) (01/14/2019), and HLD (hyperlipidemia) (01/14/2019).  Encounter for well adult exam with abnormal findings Age and sex appropriate education and counseling updated with regular exercise and diet Referrals for preventative services - none needed Immunizations addressed - declines prevnar, for flu shot today Smoking counseling  -  none needed Evidence for depression or other mood disorder - none  significant Most recent labs reviewed. I have personally reviewed and have noted: 1) the patient's medical and social history 2) The patient's current medications and supplements 3) The patient's height, weight, and BMI have been recorded in the chart   Vitamin D deficiency Last vitamin D Lab Results  Component Value Date   VD25OH 26.86 (L) 02/27/2022   Low, to start oral replacement   Hyperglycemia Lab Results  Component Value Date   HGBA1C 6.2 11/25/2023   Stable, pt to continue current medical treatment - diet, wt control   HLD (hyperlipidemia) Lab Results  Component Value Date   LDLCALC 144 (H) 11/25/2023   Uncontrolled, declines statin for now, for lower chol diet   Rash Buttock eczema like - for triam cr prn asd  Neck mass Left post mid neck, nodule like firm and mobile, pt states enlarging, wants removed, for general surgury referral  Dribbling of urine Etiology unclear, for UA with labs, also refer urology  Corn or callus Right plantar foot with pain - for podiatry referral  Palpitations Etiology unclear, ecg reviewed, likeley Pvc or PAC, declines BB trial low dose  Followup: Return in about 1 year (around 11/24/2024).  Oliver Barre, MD 11/25/2023 9:59 PM [facility]

## 2023-11-26 ENCOUNTER — Encounter: Payer: Self-pay | Admitting: Internal Medicine

## 2023-11-26 LAB — URINALYSIS, ROUTINE W REFLEX MICROSCOPIC
Bilirubin Urine: NEGATIVE
Hgb urine dipstick: NEGATIVE
Ketones, ur: NEGATIVE
Leukocytes,Ua: NEGATIVE
Nitrite: NEGATIVE
Specific Gravity, Urine: 1.02 (ref 1.000–1.030)
Total Protein, Urine: NEGATIVE
Urine Glucose: NEGATIVE
Urobilinogen, UA: 0.2 (ref 0.0–1.0)
pH: 7 (ref 5.0–8.0)

## 2023-11-26 LAB — VITAMIN D 25 HYDROXY (VIT D DEFICIENCY, FRACTURES): VITD: 32.29 ng/mL (ref 30.00–100.00)

## 2023-12-11 ENCOUNTER — Ambulatory Visit: Payer: 59 | Admitting: Podiatry

## 2023-12-11 DIAGNOSIS — B353 Tinea pedis: Secondary | ICD-10-CM | POA: Diagnosis not present

## 2023-12-11 DIAGNOSIS — D2371 Other benign neoplasm of skin of right lower limb, including hip: Secondary | ICD-10-CM | POA: Diagnosis not present

## 2023-12-11 MED ORDER — KETOCONAZOLE 2 % EX CREA: 1.0000 | TOPICAL_CREAM | Freq: Every day | CUTANEOUS | 2 refills | Status: AC

## 2023-12-11 NOTE — Progress Notes (Signed)
  Subjective:  Patient ID: JOEANGEL JEANPAUL, male    DOB: 02-16-1976,   MRN: [id number]  No chief complaint on file.   48 y.o. male presents for  concern of lesion on plantar right foot that has been present for several week.s Relates it does not cause much pain but was advised to have it checked out by PCP. Also relates some itching and a rash on the top of his foot that appeared around the same time after wearing some new socks. Has been using steroid cream which helps some  . Denies any other pedal complaints. Denies n/v/f/c.   Past Medical History:  Diagnosis Date   Allergic conjunctivitis of both eyes 01/29/2012   Allergic rhinitis, cause unspecified    Allergy    Asthma    GERD (gastroesophageal reflux disease) 01/14/2019   HLD (hyperlipidemia) 01/14/2019    Objective:  Physical Exam: Vascular: DP/PT pulses 2/4 bilateral. CFT <3 seconds. Normal hair growth on digits. No edema.  Skin. No lacerations or abrasions bilateral feet. Hyperkeratotic cored lesion noted sub third metatarsal head on right. Small area of erythematous patch noted on dorsum of right foot  Musculoskeletal: MMT 5/5 bilateral lower extremities in DF, PF, Inversion and Eversion. Deceased ROM in DF of ankle joint.  Neurological: Sensation intact to light touch.   Assessment:   1. Benign neoplasm of skin of right foot   2. Tinea pedis of right foot      Plan:  Patient was evaluated and treated and all questions answered. -Discussed benign skin lesions with patient and treatment options.  -Hyperkeratotic tissue was debrided with chisel without incident.  -Applied salycylic acid treatment to area with dressing. Advised to remove bandaging tomorrow.  -Encouraged daily moisturizing -Discussed use of pumice stone -Advised good supportive shoes and inserts -Ketoconazole sent for possible tinea pedis  -Patient to return to office as needed or sooner if condition worsens.   Louann Sjogren, DPM

## 2023-12-12 ENCOUNTER — Telehealth: Payer: Self-pay

## 2023-12-12 NOTE — Telephone Encounter (Signed)
 Attached CRM to current open encounter for results. Patient calling back to discuss results. No new/recent results seen in chart regarding EKG to call patient back with. Please advise.  Copied from CRM [id number]. Topic: Clinical - Lab/Test Results >> Dec 12, 2023  2:07 PM Dondra Prader E wrote: Reason for CRM: Pt called back to discuss his EKG results

## 2023-12-12 NOTE — Telephone Encounter (Signed)
 Copied from CRM [id number]. Topic: Clinical - Lab/Test Results >> Dec 11, 2023  4:43 PM [place] J wrote: Reason for CRM: Patient would like to go over some questions regarding EKG results.

## 2023-12-13 NOTE — Telephone Encounter (Signed)
 Hello - the ECG is essentially normal, but if wants to check the heart further, we could order an Echocardiogram, which would actually see the heart itself and makes sure it is structurally sound.    Just let me know     thanks!

## 2023-12-16 NOTE — Telephone Encounter (Signed)
 Called and let Pt know

## 2023-12-25 ENCOUNTER — Ambulatory Visit: Payer: 59 | Admitting: Urology

## 2023-12-26 ENCOUNTER — Encounter: Payer: Self-pay | Admitting: Urology

## 2023-12-26 ENCOUNTER — Ambulatory Visit: Admitting: Urology

## 2023-12-26 VITALS — BP 143/89 | HR 73 | Ht 65.0 in | Wt 170.0 lb

## 2023-12-26 DIAGNOSIS — R399 Unspecified symptoms and signs involving the genitourinary system: Secondary | ICD-10-CM | POA: Diagnosis not present

## 2023-12-26 LAB — URINALYSIS, ROUTINE W REFLEX MICROSCOPIC
Bilirubin, UA: NEGATIVE
Glucose, UA: NEGATIVE
Ketones, UA: NEGATIVE
Leukocytes,UA: NEGATIVE
Nitrite, UA: NEGATIVE
Protein,UA: NEGATIVE
RBC, UA: NEGATIVE
Specific Gravity, UA: 1.02 (ref 1.005–1.030)
Urobilinogen, Ur: 1 mg/dL (ref 0.2–1.0)
pH, UA: 7 (ref 5.0–7.5)

## 2023-12-26 LAB — BLADDER SCAN AMB NON-IMAGING

## 2023-12-26 NOTE — Addendum Note (Signed)
 Addended by: Carolin Coy on: 12/26/2023 12:18 PM   Modules accepted: Orders

## 2023-12-26 NOTE — Progress Notes (Addendum)
   Assessment: 1. Lower urinary tract symptoms (LUTS)      Plan: Reassurance FU prn  Chief Complaint: post void dribbling  History of Present Illness:  Andrew Bailey is a 48 y.o. male who is seen in consultation from Corwin Levins, MD for evaluation of LUTS.  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  He complains of dribbling small amount of urine after voiding.  Bladder scan demonstrates complete emptying.  Patient denies significant lower urinary tract symptoms other than postvoid dribbling.  PVR = 19ml PSA 11/2023= 0.69  Past Medical History:  Past Medical History:  Diagnosis Date   Allergic conjunctivitis of both eyes 01/29/2012   Allergic rhinitis, cause unspecified    Allergy    Asthma    GERD (gastroesophageal reflux disease) 01/14/2019   HLD (hyperlipidemia) 01/14/2019    Past Surgical History:  Past Surgical History:  Procedure Laterality Date   COLONOSCOPY      Allergies:  Allergies  Allergen Reactions   Fish Allergy Itching    Top of mouth   Other Other (See Comments)    Grass causes seasonal allergies    Family History:  Family History  Problem Relation Age of Onset   Diabetes Mother    Heart disease Father    Diabetes Father    Hypertension Sister    Heart disease Other    Diabetes Other    Cancer Other        colon and breast cancer   Diabetes Other    Colon cancer Neg Hx    Esophageal cancer Neg Hx    Stomach cancer Neg Hx    Rectal cancer Neg Hx     Social History:  Social History   Tobacco Use   Smoking status: Never   Smokeless tobacco: Never  Vaping Use   Vaping status: Never Used  Substance Use Topics   Alcohol use: No   Drug use: No    Review of symptoms:  Constitutional:  Negative for unexplained weight loss, night sweats, fever, chills ENT:  Negative for nose bleeds, sinus pain, painful swallowing CV:  Negative for chest pain, shortness of breath, exercise intolerance, palpitations, loss of  consciousness Resp:  Negative for cough, wheezing, shortness of breath GI:  Negative for nausea, vomiting, diarrhea, bloody stools GU:  Positives noted in HPI; otherwise negative for gross hematuria, dysuria, urinary incontinence Neuro:  Negative for seizures, poor balance, limb weakness, slurred speech Psych:  Negative for lack of energy, depression, anxiety Endocrine:  Negative for polydipsia, polyuria, symptoms of hypoglycemia (dizziness, hunger, sweating) Hematologic:  Negative for anemia, purpura, petechia, prolonged or excessive bleeding, use of anticoagulants  Allergic:  Negative for difficulty breathing or choking as a result of exposure to anything; no shellfish allergy; no allergic response (rash/itch) to materials, foods  Physical exam: BP (!) 143/89   Pulse 73   Ht 5\' 5"  (1.651 m)   Wt 170 lb (77.1 kg)   BMI 28.29 kg/m  GENERAL APPEARANCE:  Well appearing, well developed, well nourished, NAD

## 2024-11-02 ENCOUNTER — Telehealth: Payer: Self-pay

## 2024-11-02 NOTE — Telephone Encounter (Signed)
 Copied from CRM #[id number]. Topic: General - Other >> Nov 02, 2024  2:48 PM Frederich PARAS wrote: Reason for CRM: pt caling in he wants to be a pt of dr. Debby Molt,  his fther is a pat of dr. Molt. Fathers name is Karlo Goeden dob 12/29/1954. Pt would like to come in as a new pt under his dad due to dr. Lynwood rush retiring . Callback# [phone]

## 2024-11-03 NOTE — Telephone Encounter (Signed)
 Yes, I will see him.

## 2024-11-25 ENCOUNTER — Encounter: Payer: 59 | Admitting: Internal Medicine
